# Patient Record
Sex: Male | Born: 1950
Health system: Southern US, Community
[De-identification: ages and names within clinical notes are randomized; demographics above are authoritative.]

## PROBLEM LIST (undated history)

## (undated) DIAGNOSIS — N400 Enlarged prostate without lower urinary tract symptoms: Secondary | ICD-10-CM

## (undated) DIAGNOSIS — N529 Male erectile dysfunction, unspecified: Secondary | ICD-10-CM

## (undated) DIAGNOSIS — G43909 Migraine, unspecified, not intractable, without status migrainosus: Secondary | ICD-10-CM

## (undated) DIAGNOSIS — N281 Cyst of kidney, acquired: Secondary | ICD-10-CM

## (undated) HISTORY — DX: Male erectile dysfunction, unspecified: N52.9

## (undated) HISTORY — DX: Cyst of kidney, acquired: N28.1

## (undated) HISTORY — DX: Benign prostatic hyperplasia without lower urinary tract symptoms: N40.0

## (undated) HISTORY — DX: Migraine, unspecified, not intractable, without status migrainosus: G43.909

---

## 2004-07-26 ENCOUNTER — Ambulatory Visit: Payer: Self-pay | Admitting: Urology

## 2007-07-15 ENCOUNTER — Ambulatory Visit: Payer: Self-pay | Admitting: Urology

## 2009-07-10 ENCOUNTER — Ambulatory Visit: Payer: Self-pay | Admitting: Urology

## 2011-03-30 ENCOUNTER — Ambulatory Visit: Payer: Self-pay | Admitting: Family Medicine

## 2011-07-08 ENCOUNTER — Ambulatory Visit: Payer: Self-pay | Admitting: Urology

## 2012-07-06 DIAGNOSIS — N411 Chronic prostatitis: Secondary | ICD-10-CM | POA: Insufficient documentation

## 2012-07-06 DIAGNOSIS — R972 Elevated prostate specific antigen [PSA]: Secondary | ICD-10-CM | POA: Insufficient documentation

## 2012-07-06 DIAGNOSIS — N529 Male erectile dysfunction, unspecified: Secondary | ICD-10-CM | POA: Insufficient documentation

## 2012-07-08 DIAGNOSIS — R339 Retention of urine, unspecified: Secondary | ICD-10-CM | POA: Insufficient documentation

## 2014-08-01 DIAGNOSIS — Z8719 Personal history of other diseases of the digestive system: Secondary | ICD-10-CM | POA: Insufficient documentation

## 2016-07-01 DIAGNOSIS — E785 Hyperlipidemia, unspecified: Secondary | ICD-10-CM | POA: Insufficient documentation

## 2018-05-15 ENCOUNTER — Ambulatory Visit: Payer: Self-pay | Admitting: Urology

## 2018-05-28 ENCOUNTER — Encounter: Payer: Self-pay | Admitting: Urology

## 2018-05-28 ENCOUNTER — Ambulatory Visit (INDEPENDENT_AMBULATORY_CARE_PROVIDER_SITE_OTHER): Payer: Medicare Other | Admitting: Urology

## 2018-05-28 VITALS — BP 138/71 | HR 69 | Ht 70.0 in | Wt 180.0 lb

## 2018-05-28 DIAGNOSIS — N281 Cyst of kidney, acquired: Secondary | ICD-10-CM | POA: Insufficient documentation

## 2018-05-28 DIAGNOSIS — R972 Elevated prostate specific antigen [PSA]: Secondary | ICD-10-CM

## 2018-05-28 DIAGNOSIS — N138 Other obstructive and reflux uropathy: Secondary | ICD-10-CM | POA: Insufficient documentation

## 2018-05-28 DIAGNOSIS — N5201 Erectile dysfunction due to arterial insufficiency: Secondary | ICD-10-CM | POA: Diagnosis not present

## 2018-05-28 DIAGNOSIS — N400 Enlarged prostate without lower urinary tract symptoms: Secondary | ICD-10-CM | POA: Diagnosis not present

## 2018-05-28 DIAGNOSIS — N401 Enlarged prostate with lower urinary tract symptoms: Secondary | ICD-10-CM | POA: Insufficient documentation

## 2018-05-28 LAB — URINALYSIS, COMPLETE
Bilirubin, UA: NEGATIVE
Glucose, UA: NEGATIVE
Leukocytes, UA: NEGATIVE
Nitrite, UA: NEGATIVE
Specific Gravity, UA: >1.03 — ABNORMAL HIGH (ref 1.005–1.030)
Urobilinogen, Ur: 1 mg/dL (ref 0.2–1.0)
pH, UA: 5 (ref 5.0–7.5)

## 2018-05-28 LAB — MICROSCOPIC EXAMINATION
Epithelial Cells (non renal): NONE SEEN /hpf (ref 0–10)
WBC, UA: NONE SEEN /hpf (ref 0–5)

## 2018-05-28 LAB — BLADDER SCAN AMB NON-IMAGING

## 2018-05-28 NOTE — Progress Notes (Signed)
   05/28/2018 2:09 PM   Derrick Carroll 07-16-1951 [id number]  Referring provider: No referring provider defined for this encounter.  Chief Complaint  Patient presents with  . Establish Care    HPI: 67 year old male presents to establish local urologic care.  He has previously been followed by Dr. Achilles Carroll for BPH and renal cysts.  He has a history of an elevated PSA.  He last saw Dr. Achilles Carroll at [facility] in March 2019.  He had a TURP at [facility] in 2017.  He has no bothersome lower urinary tract symptoms.  He is taking finasteride 5 mg daily and sildenafil as needed for ED.  He has been followed for a left parapelvic cyst with mild pyelocaliectasis.  Renal ultrasound March 2019 was stable.  Last PSA was March 2018 and was 0.12 (uncorrected).   PMH: Hypercholesterolemia  Surgical History: . INGUINAL HERNIA REPAIR Left  . INGUINAL HERNIA REPAIR Right  . PR TRANSURETHRAL ELEC-SURG PROSTATECTOM N/A 07/01/2016   Home Medications:  Simvastatin Finasteride Sildenafil  Allergies: No Known Allergies  Family History: No family history on file.  Social History:  reports that he quit smoking about 44 years ago. His smoking use included cigarettes. He does not have any smokeless tobacco history on file. He reports that he drank alcohol. He reports that he does not use drugs.  ROS: UROLOGY Frequent Urination?: No Hard to postpone urination?: No Burning/pain with urination?: No Get up at night to urinate?: Yes Leakage of urine?: No Urine stream starts and stops?: No Trouble starting stream?: No Do you have to strain to urinate?: No Blood in urine?: No Urinary tract infection?: No Sexually transmitted disease?: No Injury to kidneys or bladder?: No Painful intercourse?: No Weak stream?: No Erection problems?: No Penile pain?: No  Gastrointestinal Nausea?: No Vomiting?: No Indigestion/heartburn?: No Diarrhea?: No Constipation?: No  Constitutional Fever: No Night sweats?: No Weight  loss?: No Fatigue?: No  Skin Skin rash/lesions?: No Itching?: No  Eyes Blurred vision?: No Double vision?: No  Ears/Nose/Throat Sore throat?: No Sinus problems?: No  Hematologic/Lymphatic Swollen glands?: No Easy bruising?: No  Cardiovascular Leg swelling?: No Chest pain?: No  Respiratory Cough?: No Shortness of breath?: No  Endocrine Excessive thirst?: No  Musculoskeletal Back pain?: No Joint pain?: No  Neurological Headaches?: No Dizziness?: No  Psychologic Depression?: No Anxiety?: No  Physical Exam: BP 138/71 (BP Location: Left Arm, Patient Position: Sitting, Cuff Size: Normal)   Pulse 69   Ht 5\' 10"  (1.778 m)   Wt 180 lb (81.6 kg)   BMI 25.83 kg/m   Constitutional:  Alert and oriented, No acute distress. HEENT: [place] AT, moist mucus membranes.  Trachea midline, no masses. Cardiovascular: No clubbing, cyanosis, or edema. Respiratory: Normal respiratory effort, no increased work of breathing. GU: No CVA tenderness, DRE done by Dr. Achilles Carroll March 2019 Lymph: No cervical or inguinal lymphadenopathy. Skin: No rashes, bruises or suspicious lesions. Neurologic: Grossly intact, no focal deficits, moving all 4 extremities. Psychiatric: Normal mood and affect.  Laboratory Data:  Urinalysis Dipstick/microscopy negative   Assessment & Plan:   Currently doing well.  His last PSA was March 2018 and was drawn today.  Will schedule a follow-up renal ultrasound March 2020 and see him back in 1 year.  Return in about 1 year (around 05/29/2019) for Recheck.   Riki Altes, MD  [facility] [address], [address] [place], [place] [address] [phone]

## 2018-05-29 ENCOUNTER — Telehealth: Payer: Self-pay | Admitting: Family Medicine

## 2018-05-29 LAB — PSA: Prostate Specific Ag, Serum: 0.1 ng/mL (ref 0.0–4.0)

## 2018-05-29 NOTE — Telephone Encounter (Signed)
LMOM and notified patient

## 2018-05-29 NOTE — Telephone Encounter (Signed)
-----   Message from Riki Altes, MD sent at 05/29/2018  7:02 AM EDT ----- PSA stable at 0.1

## 2018-06-30 ENCOUNTER — Ambulatory Visit
Admission: RE | Admit: 2018-06-30 | Discharge: 2018-06-30 | Disposition: A | Discharge status: Home / Self Care | Payer: Medicare Other | Source: Ambulatory Visit | Attending: Urology | Admitting: Urology

## 2018-06-30 DIAGNOSIS — N281 Cyst of kidney, acquired: Secondary | ICD-10-CM | POA: Diagnosis not present

## 2018-07-02 ENCOUNTER — Telehealth: Payer: Self-pay | Admitting: Family Medicine

## 2018-07-02 NOTE — Telephone Encounter (Signed)
LMOM notifying patient RUS has no Significant abnormalities.

## 2018-07-02 NOTE — Telephone Encounter (Signed)
-----   Message from Riki AltesScott C Stoioff, MD sent at 07/02/2018  7:32 AM EST ----- Renal ultrasound showed stable renal cysts and no significant abnormalities.

## 2018-10-19 ENCOUNTER — Other Ambulatory Visit: Payer: Self-pay | Admitting: Urology

## 2018-10-19 NOTE — Telephone Encounter (Signed)
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

## 2018-10-20 MED ORDER — FINASTERIDE 5 MG PO TABS: 5.0000 mg | ORAL_TABLET | Freq: Every day | ORAL | 11 refills | Status: DC

## 2018-10-20 NOTE — Telephone Encounter (Signed)
RX sent to pharmacy  

## 2019-01-30 ENCOUNTER — Emergency Department: Payer: Medicare Other

## 2019-01-30 ENCOUNTER — Emergency Department
Admission: EM | Admit: 2019-01-30 | Discharge: 2019-01-30 | Disposition: A | Discharge status: Home / Self Care | Payer: Medicare Other | Attending: Emergency Medicine | Admitting: Emergency Medicine

## 2019-01-30 ENCOUNTER — Other Ambulatory Visit: Payer: Self-pay

## 2019-01-30 DIAGNOSIS — Z87891 Personal history of nicotine dependence: Secondary | ICD-10-CM | POA: Insufficient documentation

## 2019-01-30 DIAGNOSIS — R51 Headache: Secondary | ICD-10-CM | POA: Diagnosis not present

## 2019-01-30 DIAGNOSIS — Z79899 Other long term (current) drug therapy: Secondary | ICD-10-CM | POA: Diagnosis not present

## 2019-01-30 DIAGNOSIS — R42 Dizziness and giddiness: Secondary | ICD-10-CM | POA: Diagnosis not present

## 2019-01-30 DIAGNOSIS — R519 Headache, unspecified: Secondary | ICD-10-CM

## 2019-01-30 LAB — URINALYSIS, COMPLETE (UACMP) WITH MICROSCOPIC
Bacteria, UA: NONE SEEN
Bilirubin Urine: NEGATIVE
Glucose, UA: NEGATIVE mg/dL
Hgb urine dipstick: NEGATIVE
Ketones, ur: NEGATIVE mg/dL
Leukocytes,Ua: NEGATIVE
Nitrite: NEGATIVE
Protein, ur: NEGATIVE mg/dL
Specific Gravity, Urine: 1.01 (ref 1.005–1.030)
Squamous Epithelial / LPF: NONE SEEN (ref 0–5)
WBC, UA: NONE SEEN WBC/hpf (ref 0–5)
pH: 6 (ref 5.0–8.0)

## 2019-01-30 LAB — TROPONIN I (HIGH SENSITIVITY)
Troponin I (High Sensitivity): 5 ng/L (ref <18)
Troponin I (High Sensitivity): 5 ng/L (ref <18)

## 2019-01-30 LAB — COMPREHENSIVE METABOLIC PANEL
ALT: 26 U/L (ref 0–44)
AST: 25 U/L (ref 15–41)
Albumin: 4 g/dL (ref 3.5–5.0)
Alkaline Phosphatase: 46 U/L (ref 38–126)
Anion gap: 9 (ref 5–15)
BUN: 15 mg/dL (ref 8–23)
CO2: 25 mmol/L (ref 22–32)
Calcium: 8.7 mg/dL — ABNORMAL LOW (ref 8.9–10.3)
Chloride: 106 mmol/L (ref 98–111)
Creatinine, Ser: 0.87 mg/dL (ref 0.61–1.24)
GFR calc Af Amer: >60 mL/min (ref >60)
GFR calc non Af Amer: >60 mL/min (ref >60)
Glucose, Bld: 108 mg/dL — ABNORMAL HIGH (ref 70–99)
Potassium: 3.6 mmol/L (ref 3.5–5.1)
Sodium: 140 mmol/L (ref 135–145)
Total Bilirubin: 0.8 mg/dL (ref 0.3–1.2)
Total Protein: 7 g/dL (ref 6.5–8.1)

## 2019-01-30 LAB — CBC
HCT: 48.1 % (ref 39.0–52.0)
Hemoglobin: 16 g/dL (ref 13.0–17.0)
MCH: 32.2 pg (ref 26.0–34.0)
MCHC: 33.3 g/dL (ref 30.0–36.0)
MCV: 96.8 fL (ref 80.0–100.0)
Platelets: 221 10*3/uL (ref 150–400)
RBC: 4.97 MIL/uL (ref 4.22–5.81)
RDW: 12.6 % (ref 11.5–15.5)
WBC: 5.8 10*3/uL (ref 4.0–10.5)
nRBC: 0 % (ref 0.0–0.2)

## 2019-01-30 MED ORDER — SODIUM CHLORIDE 0.9 % IV BOLUS
1000.0000 mL | Freq: Once | INTRAVENOUS | Status: AC
  Administered 2019-01-30: 1000 mL via INTRAVENOUS

## 2019-01-30 MED ORDER — MECLIZINE HCL 25 MG PO TABS
25.0000 mg | ORAL_TABLET | Freq: Once | ORAL | Status: AC
  Administered 2019-01-30: 25 mg via ORAL
  Filled 2019-01-30: qty 1

## 2019-01-30 NOTE — ED Notes (Signed)
Pt able to ambulate without difficulty.

## 2019-01-30 NOTE — Discharge Instructions (Signed)
Your MRI of the brain and labs were all okay today. Take meclizine as needed to control vertigo symptoms.

## 2019-01-30 NOTE — ED Notes (Signed)
Patient transported to CT 

## 2019-01-30 NOTE — ED Triage Notes (Addendum)
Patient reports felt "lightheaded" when he woke up during the night and rolled over in the bed.  Feeling of lightheadedness continued when he got up to let dog outside, also reports having nausea.  Also reports left arm felt tingly.  Patient reports woke up around 2:30 am (to bed b/w 11 and 11:30 pm after taking Goody powder for headache).

## 2019-01-30 NOTE — ED Notes (Signed)
Pt states last night he had a headache and took a "goody powder". Pt states while lying in bed this morning before getting up 'I felt funny, I moved my head and I thought, that doesn't feel right". Pt states he then heard his dog barking, got up to let his dog out , felt lightheaded, dizzy and "felt like I was going to pass out". Pt denies room spinning. Pt states he had felt intermittently nauseated and had some left arm tingling that resolved pta.

## 2019-01-30 NOTE — ED Notes (Signed)
Pt on phone with MRI, report to angela, rn.

## 2019-01-30 NOTE — ED Notes (Signed)
Pt currently off the unit, in MRI

## 2019-01-30 NOTE — ED Provider Notes (Signed)
[facility] Emergency Department Provider Note  ____________________________________________  Time seen: Approximately 6:42 AM  I have reviewed the triage vital signs and the nursing notes.   HISTORY  Chief Complaint Dizziness   HPI Derrick Carroll is a 68 y.o. male with a history of BPH who presents for evaluation of dizziness.  Patient reports that he had a moderate headache yesterday.  Before going to bed he took a Goody powder.  He reports having history of headaches like the one from last night very frequently.  This morning he woke up with his dog barking the garage.  Before he got up from bed he felt lightheaded.  After getting up patient reports that he felt dizzy.  He describes dizziness as feeling like he was going to pass out but also feeling off balance.  The headache has resolved.  Patient denies history of vertigo.  He denies any head trauma.  He denies changes in vision, slurred speech, facial droop, unilateral weakness or numbness, personal family history of stroke.  Patient is a former smoker.  He denies chest pain, fever, shortness of breath.  Does endorse mild nausea but no vomiting.   Patient Active Problem List   Diagnosis Date Noted  . Renal cyst 05/28/2018  . BPH without obstruction/lower urinary tract symptoms 05/28/2018  . Erectile dysfunction due to arterial insufficiency 05/28/2018    No past surgical history on file.  Prior to Admission medications   Medication Sig Start Date End Date Taking? Authorizing Provider  finasteride (PROSCAR) 5 MG tablet Take 1 tablet (5 mg total) by mouth daily. 10/20/18   Stoioff, Verna CzechScott C, MD  Multiple Vitamin (MULTI-VITAMINS) TABS Take by mouth.    [provider]  sildenafil (REVATIO) 20 MG tablet 3-5 tablets po daily as needed 10/23/17   [provider]  simvastatin (ZOCOR) 20 MG tablet TAKE 1 TABLET BY MOUTH EVERY DAY AT NIGHT 07/05/18   [provider]    Allergies  Patient has no known allergies.  No family history on file.  Social History Social History   Tobacco Use  . Smoking status: Former Smoker    Types: Cigarettes    Quit date: 07/29/1973    Years since quitting: 45.5  Substance Use Topics  . Alcohol use: Not Currently  . Drug use: Never    Review of Systems  Constitutional: Negative for fever. + dizziness Eyes: Negative for visual changes. ENT: Negative for sore throat. Neck: No neck pain  Cardiovascular: Negative for chest pain. Respiratory: Negative for shortness of breath. Gastrointestinal: Negative for abdominal pain, vomiting or diarrhea. + nausea Genitourinary: Negative for dysuria. Musculoskeletal: Negative for back pain. Skin: Negative for rash. Neurological: Negative for weakness or numbness. + HA Psych: No SI or HI  ____________________________________________   PHYSICAL EXAM:  VITAL SIGNS: ED Triage Vitals  Enc Vitals Group     BP 01/30/19 0538 (!) 158/95     Pulse Rate 01/30/19 0538 (!) 59     Resp 01/30/19 0538 18     Temp 01/30/19 0538 98.1 F (36.7 C)     Temp Source 01/30/19 0538 Oral     SpO2 01/30/19 0538 97 %     Weight 01/30/19 0539 180 lb (81.6 kg)     Height 01/30/19 0539 5\' 10"  (1.778 m)     Head Circumference --      Peak Flow --      Pain Score 01/30/19 0539 0     Pain Loc --  Pain Edu? --      Excl. in [place]? --     Constitutional: Alert and oriented. Well appearing and in no apparent distress. HEENT:      Head: Normocephalic and atraumatic.         Eyes: Conjunctivae are normal. Sclera is non-icteric.       Mouth/Throat: Mucous membranes are moist.       Neck: Supple with no signs of meningismus. Cardiovascular: Regular rate and rhythm. No murmurs, gallops, or rubs. 2+ symmetrical distal pulses are present in all extremities. No JVD. Respiratory: Normal respiratory effort. Lungs are clear to auscultation bilaterally. No wheezes, crackles, or rhonchi.  Gastrointestinal: Soft, non  tender, and non distended with positive bowel sounds. No rebound or guarding. Musculoskeletal: Nontender with normal range of motion in all extremities. No edema, cyanosis, or erythema of extremities. Neurologic: Normal speech and language. Face is symmetric.  Intact strength and sensation x4, no dysmetria or pronator drift Skin: Skin is warm, dry and intact. No rash noted. Psychiatric: Mood and affect are normal. Speech and behavior are normal.  ____________________________________________   LABS (all labs ordered are listed, but only abnormal results are displayed)  Labs Reviewed  COMPREHENSIVE METABOLIC PANEL - Abnormal; Notable for the following components:      Result Value   Glucose, Bld 108 (*)    Calcium 8.7 (*)    All other components within normal limits  CBC  TROPONIN I (HIGH SENSITIVITY)  TROPONIN I (HIGH SENSITIVITY)  URINALYSIS, COMPLETE (UACMP) WITH MICROSCOPIC   ____________________________________________  EKG  ED ECG REPORT I, Rudene Re, the attending physician, personally viewed and interpreted this ECG.  Normal sinus rhythm, rate of 61, normal intervals, normal axis, no ST elevations or depressions. ____________________________________________  RADIOLOGY  I have personally reviewed the images performed during this visit and I agree with the Radiologist's read.   Interpretation by Radiologist:  Ct Head Wo Contrast  Result Date: 01/30/2019 CLINICAL DATA:  68 y/o M; lightheadedness, nausea, left upper extremity tingling. EXAM: CT HEAD WITHOUT CONTRAST TECHNIQUE: Contiguous axial images were obtained from the base of the skull through the vertex without intravenous contrast. COMPARISON:  03/30/2011 MRI head. FINDINGS: Brain: No evidence of acute infarction, hemorrhage, hydrocephalus, extra-axial collection or mass lesion/mass effect. Vascular: No hyperdense vessel or unexpected calcification. Skull: Normal. Negative for fracture or focal lesion.  Sinuses/Orbits: No acute finding. Other: None. IMPRESSION: No acute intracranial abnormality. Stable unremarkable CT of the head given differences in technique. Electronically Signed   By: Kristine Garbe M.D.   On: 01/30/2019 06:48      ____________________________________________   PROCEDURES  Procedure(s) performed: None Procedures Critical Care performed:  None ____________________________________________   INITIAL IMPRESSION / ASSESSMENT AND PLAN / ED COURSE  68 y.o. male with a history of BPH who presents for evaluation of dizziness since this am. Ddx dehydration, orthostasis, anemia, AKI, peripheral vertigo, stroke, head bleed, dysrhythmia.  Patient is completely neurologically intact.  EKG with no evidence of dysrhythmias or ischemia. Patient be monitored on telemetry.  Labs showing normal glucose, normal electrolytes, normal kidney function, no anemia, negative troponin.  Head CT showed no acute abnormality. Will give IVF and meclizine.   _________________________ 6:51 AM on 01/30/2019 -----------------------------------------  Patient report that the dizzy spells are now only present when he moves his head making it more concerning for vertigo.  We will get an MRI to rule out posterior fossa stroke. Care transferred to Dr. Joni Fears      As part  of my medical decision making, I reviewed the following data within the electronic MEDICAL RECORD NUMBER Nursing notes reviewed and incorporated, Labs reviewed , EKG interpreted , Old chart reviewed, Radiograph reviewed , Notes from prior ED visits and [place] Controlled Substance Database    Pertinent labs & imaging results that were available during my care of the patient were reviewed by me and considered in my medical decision making (see chart for details).    ____________________________________________   FINAL CLINICAL IMPRESSION(S) / ED DIAGNOSES  Final diagnoses:  Vertigo  Acute nonintractable headache, unspecified  headache type      NEW MEDICATIONS STARTED DURING THIS VISIT:  ED Discharge Orders    None       Note:  This document was prepared using Dragon voice recognition software and may include unintentional dictation errors.    Don PerkingVeronese, [place], MD 01/30/19 [phone]

## 2019-01-30 NOTE — ED Provider Notes (Signed)
-----------------------------------------   1:22 PM on 01/30/2019 -----------------------------------------   Vitals remain normal.  Patient symptoms have dramatically improved and he is ambulatory without difficulty.  MRI is normal.  This appears to be peripheral vertigo which is now resolving.  He can continue to take meclizine as needed, follow-up with primary care.  No evidence of intracranial hemorrhage, stroke, tumor, intracranial hypertension or other acute pathology.   Carrie Mew, MD 01/30/19 1322

## 2019-04-27 DIAGNOSIS — G43019 Migraine without aura, intractable, without status migrainosus: Secondary | ICD-10-CM | POA: Insufficient documentation

## 2019-05-28 ENCOUNTER — Other Ambulatory Visit: Payer: Self-pay

## 2019-05-28 ENCOUNTER — Encounter: Payer: Self-pay | Admitting: Urology

## 2019-05-28 ENCOUNTER — Ambulatory Visit: Payer: Self-pay | Admitting: Urology

## 2019-05-28 ENCOUNTER — Ambulatory Visit (INDEPENDENT_AMBULATORY_CARE_PROVIDER_SITE_OTHER): Payer: Medicare Other | Admitting: Urology

## 2019-05-28 VITALS — BP 141/92 | HR 68 | Ht 70.0 in | Wt 184.0 lb

## 2019-05-28 DIAGNOSIS — N5201 Erectile dysfunction due to arterial insufficiency: Secondary | ICD-10-CM | POA: Diagnosis not present

## 2019-05-28 DIAGNOSIS — N401 Enlarged prostate with lower urinary tract symptoms: Secondary | ICD-10-CM | POA: Diagnosis not present

## 2019-05-28 LAB — BLADDER SCAN AMB NON-IMAGING

## 2019-05-28 MED ORDER — SILDENAFIL CITRATE 20 MG PO TABS: ORAL_TABLET | ORAL | 0 refills | Status: DC

## 2019-05-28 MED ORDER — FINASTERIDE 5 MG PO TABS: 5.0000 mg | ORAL_TABLET | Freq: Every day | ORAL | 6 refills | Status: DC

## 2019-05-28 NOTE — Progress Notes (Signed)
05/28/2019 9:44 AM   Derrick Carroll [birth year] [id number]  Referring provider: Jerl Mina, MD [address] [place],  [place] [address]  Chief Complaint  Patient presents with  . Follow-up    Urologic history: 1.  BPH  -TURP Dr. [name] 2017  -Remains on finasteride 5 mg daily  2.  Erectile dysfunction  -Generic sildenafil  3.  Renal cysts  -Bilateral renal cysts  -Simple   HPI: 68 y.o. male previously followed by Dr. [name].  He presents for annual follow-up.  He has mild lower urinary tract symptoms with occasional frequency, decreased stream and sensation of incomplete emptying.  He gets up once per night to void.  IPSS completed today was 4/35 with a QoL rated 2/6.  He has continued on finasteride since his TURP.  PSA last year was 0.1.  Renal ultrasound performed December 2019 showed a 2.6 cm simple cyst in the right kidney and multiple left renal cysts largest measuring 4 cm which had mildly increased in size since a prior comparison study in 2012  Denies dysuria or gross hematuria.  Denies flank, abdominal or pelvic pain.   PMH: Past Medical History:  Diagnosis Date  . BPH (benign prostatic hyperplasia)   . ED (erectile dysfunction)   . Migraine headache   . Renal cyst     Surgical History: No past surgical history on file.  Home Medications:  Allergies as of 05/28/2019      Reactions   Chlorpheniramine    Other reaction(s): Unknown      Medication List       Accurate as of May 28, 2019  9:44 AM. If you have any questions, ask your nurse or doctor.        Acetaminophen 500 MG coapsule Take by mouth.   finasteride 5 MG tablet Commonly known as: PROSCAR Take 1 tablet (5 mg total) by mouth daily.   MAGNESIUM OXIDE PO Take 1,400 mg by mouth daily.   Multi-Vitamins Tabs Take by mouth.   sildenafil 20 MG tablet Commonly known as: REVATIO 3-5 tablets po daily as needed   simvastatin 20 MG tablet Commonly known as:  ZOCOR TAKE 1 TABLET BY MOUTH EVERY DAY AT NIGHT   SUMAtriptan 100 MG tablet Commonly known as: IMITREX Take 100 mg by mouth 2 (two) times daily as needed.       Allergies:  Allergies  Allergen Reactions  . Chlorpheniramine     Other reaction(s): Unknown    Family History: No family history on file.  Social History:  reports that he quit smoking about 45 years ago. His smoking use included cigarettes. He has never used smokeless tobacco. He reports previous alcohol use. He reports that he does not use drugs.  ROS: UROLOGY Frequent Urination?: No Hard to postpone urination?: No Burning/pain with urination?: No Get up at night to urinate?: No Leakage of urine?: No Urine stream starts and stops?: No Trouble starting stream?: No Do you have to strain to urinate?: No Blood in urine?: No Urinary tract infection?: No Sexually transmitted disease?: No Injury to kidneys or bladder?: No Painful intercourse?: No Weak stream?: No Erection problems?: No Penile pain?: No  Gastrointestinal Nausea?: No Vomiting?: No Indigestion/heartburn?: No Diarrhea?: No Constipation?: No  Constitutional Fever: No Night sweats?: No Weight loss?: No Fatigue?: No  Skin Skin rash/lesions?: No Itching?: No  Eyes Blurred vision?: No Double vision?: No  Ears/Nose/Throat Sore throat?: No Sinus problems?: No  Hematologic/Lymphatic Swollen glands?: No Easy  bruising?: No  Cardiovascular Leg swelling?: No Chest pain?: No  Respiratory Cough?: No Shortness of breath?: No  Endocrine Excessive thirst?: No  Musculoskeletal Back pain?: No Joint pain?: No  Neurological Headaches?: No Dizziness?: No  Psychologic Depression?: No Anxiety?: No  Physical Exam: BP (!) 141/92 (BP Location: Left Arm, Patient Position: Sitting, Cuff Size: Normal)   Pulse 68   Ht 5\' 10"  (1.778 m)   Wt 184 lb (83.5 kg)   BMI 26.40 kg/m   Constitutional:  Alert and oriented, No acute distress.  HEENT: [place] AT, moist mucus membranes.  Trachea midline, no masses. Cardiovascular: No clubbing, cyanosis, or edema. Respiratory: Normal respiratory effort, no increased work of breathing. GI: Abdomen is soft, nontender, nondistended, no abdominal masses GU: No CVA tenderness Lymph: No cervical or inguinal lymphadenopathy. Skin: No rashes, bruises or suspicious lesions. Neurologic: Grossly intact, no focal deficits, moving all 4 extremities. Psychiatric: Normal mood and affect.   Assessment & Plan:    - BPH without lower urinary tract symptoms Stable.  He desires to continue finasteride.  - Erectile dysfunction Stable.  Continue sildenafil  Abbie Sons, MD  [facility] [address] [place], [place] [address] [phone]

## 2019-05-29 LAB — PSA: Prostate Specific Ag, Serum: 0.1 ng/mL (ref 0.0–4.0)

## 2019-05-30 ENCOUNTER — Encounter: Payer: Self-pay | Admitting: Urology

## 2019-05-31 ENCOUNTER — Telehealth: Payer: Self-pay

## 2019-05-31 NOTE — Telephone Encounter (Signed)
-----   Message from Abbie Sons, MD sent at 05/29/2019  9:57 AM EDT ----- PSA stable at 0.1

## 2019-05-31 NOTE — Telephone Encounter (Signed)
Called pt informed him of information below. Pt gave verbal understanding.  

## 2020-05-12 ENCOUNTER — Ambulatory Visit: Payer: Medicare PPO | Attending: Internal Medicine

## 2020-05-12 ENCOUNTER — Other Ambulatory Visit: Payer: Self-pay | Admitting: Internal Medicine

## 2020-05-12 DIAGNOSIS — Z23 Encounter for immunization: Secondary | ICD-10-CM

## 2020-05-12 NOTE — Progress Notes (Signed)
   Covid-19 Vaccination Clinic  Name:  [name]    MRN: [id number] DOB: 22-Mar-1951  05/12/2020  Mr. Mccutchan was observed post Covid-19 immunization for 15 minutes without incident. He was provided with Vaccine Information Sheet and instruction to access the V-Safe system.   Mr. Carriere was instructed to call 911 with any severe reactions post vaccine: Marland Kitchen Difficulty breathing  . Swelling of face and throat  . A fast heartbeat  . A bad rash all over body  . Dizziness and weakness

## 2020-05-14 ENCOUNTER — Other Ambulatory Visit: Payer: Self-pay | Admitting: Urology

## 2020-05-31 ENCOUNTER — Ambulatory Visit: Payer: Medicare PPO | Admitting: Urology

## 2020-05-31 ENCOUNTER — Other Ambulatory Visit: Payer: Self-pay

## 2020-05-31 ENCOUNTER — Encounter: Payer: Self-pay | Admitting: Urology

## 2020-05-31 VITALS — BP 152/91 | HR 61 | Ht 70.0 in | Wt 183.0 lb

## 2020-05-31 DIAGNOSIS — N401 Enlarged prostate with lower urinary tract symptoms: Secondary | ICD-10-CM | POA: Diagnosis not present

## 2020-05-31 LAB — BLADDER SCAN AMB NON-IMAGING: Scan Result: 7

## 2020-05-31 MED ORDER — FINASTERIDE 5 MG PO TABS: 5.0000 mg | ORAL_TABLET | Freq: Every day | ORAL | 0 refills | Status: DC

## 2020-05-31 MED ORDER — SILDENAFIL CITRATE 20 MG PO TABS: ORAL_TABLET | ORAL | 0 refills | Status: DC

## 2020-05-31 NOTE — Progress Notes (Signed)
05/31/2020 9:23 AM   Derrick Carroll [birth year] [id number]  Referring provider: Jerl Mina, MD [address] [place],  [place] [address]  Chief Complaint  Patient presents with  . Benign Prostatic Hypertrophy    Urologic history: 1.  BPH             -TURP Dr. [name] 2017             -Remains on finasteride 5 mg daily  2.  Erectile dysfunction             -Generic sildenafil  3.  Renal cysts             -Bilateral renal cysts             -Simple   HPI: 69 y.o. male presents for annual follow-up.   Since last years visit lower urinary tract symptoms are stable  Denies dysuria, gross hematuria  Denies flank, abdominal or pelvic pain  Remains on finasteride  Sildenafil effective for ED   PMH: Past Medical History:  Diagnosis Date  . BPH (benign prostatic hyperplasia)   . ED (erectile dysfunction)   . Migraine headache   . Renal cyst     Surgical History: No past surgical history on file.  Home Medications:  Allergies as of 05/31/2020      Reactions   Chlorpheniramine    Other reaction(s): Unknown      Medication List       Accurate as of May 31, 2020  9:23 AM. If you have any questions, ask your nurse or doctor.        Acetaminophen 500 MG capsule Take by mouth.   finasteride 5 MG tablet Commonly known as: PROSCAR TAKE 1 TABLET BY MOUTH EVERY DAY   MAGNESIUM OXIDE PO Take 1,400 mg by mouth daily.   Multi-Vitamins Tabs Take by mouth.   sildenafil 20 MG tablet Commonly known as: REVATIO 3-5 tablets po daily as needed   simvastatin 20 MG tablet Commonly known as: ZOCOR TAKE 1 TABLET BY MOUTH EVERY DAY AT NIGHT   SUMAtriptan 100 MG tablet Commonly known as: IMITREX Take 100 mg by mouth 2 (two) times daily as needed.       Allergies:  Allergies  Allergen Reactions  . Chlorpheniramine     Other reaction(s): Unknown    Family History: No family history on file.  Social History:  reports that he  quit smoking about 46 years ago. His smoking use included cigarettes. He has never used smokeless tobacco. He reports previous alcohol use. He reports that he does not use drugs.   Physical Exam: BP (!) 152/91   Pulse 61   Ht 5\' 10"  (1.778 m)   Wt 183 lb (83 kg)   BMI 26.26 kg/m   Constitutional:  Alert and oriented, No acute distress. HEENT: [place] AT, moist mucus membranes.  Trachea midline, no masses. Cardiovascular: No clubbing, cyanosis, or edema. Respiratory: Normal respiratory effort, no increased work of breathing. GI: Abdomen is soft, nontender, nondistended, no abdominal masses GU: Prostate 35 g, smooth without nodules Lymph: No cervical or inguinal lymphadenopathy. Skin: No rashes, bruises or suspicious lesions. Neurologic: Grossly intact, no focal deficits, moving all 4 extremities. Psychiatric: Normal mood and affect.   Assessment & Plan:    1. Benign prostatic hyperplasia with LUTS  Stable voiding symptoms on finasteride which was refilled  PVR bladder scan 7 mL  Desires to continue prostate cancer screening and PSA ordered  2.  Erectile dysfunction  Stable on sildenafil, refill sent  3.  Renal cysts  Asymptomatic  Continue annual follow-up   Riki Altes, MD  [facility] [address] [place], [place] [address] [phone]

## 2020-06-01 LAB — URINALYSIS, COMPLETE
Bilirubin, UA: NEGATIVE
Glucose, UA: NEGATIVE
Ketones, UA: NEGATIVE
Leukocytes,UA: NEGATIVE
Nitrite, UA: NEGATIVE
Protein,UA: NEGATIVE
Specific Gravity, UA: 1.025 (ref 1.005–1.030)
Urobilinogen, Ur: 0.2 mg/dL (ref 0.2–1.0)
pH, UA: 5 (ref 5.0–7.5)

## 2020-06-01 LAB — PSA: Prostate Specific Ag, Serum: 0.1 ng/mL (ref 0.0–4.0)

## 2020-06-01 LAB — MICROSCOPIC EXAMINATION: Bacteria, UA: NONE SEEN

## 2020-06-04 NOTE — Progress Notes (Signed)
PSA stable 0.1

## 2020-06-05 ENCOUNTER — Telehealth: Payer: Self-pay | Admitting: *Deleted

## 2020-06-05 NOTE — Telephone Encounter (Signed)
-----   Message from Riki Altes, MD sent at 06/04/2020 11:53 AM EST ----- PSA stable 0.1

## 2020-06-07 NOTE — Telephone Encounter (Signed)
Spoke with patient and advised results   

## 2020-06-15 ENCOUNTER — Other Ambulatory Visit: Payer: Self-pay | Admitting: General Surgery

## 2020-06-15 ENCOUNTER — Other Ambulatory Visit (HOSPITAL_COMMUNITY): Payer: Self-pay | Admitting: General Surgery

## 2020-06-15 DIAGNOSIS — R1031 Right lower quadrant pain: Secondary | ICD-10-CM

## 2020-06-19 ENCOUNTER — Ambulatory Visit
Admission: RE | Admit: 2020-06-19 | Discharge: 2020-06-19 | Disposition: A | Discharge status: Home / Self Care | Payer: Medicare PPO | Source: Ambulatory Visit | Attending: General Surgery | Admitting: General Surgery

## 2020-06-19 ENCOUNTER — Other Ambulatory Visit: Payer: Self-pay

## 2020-06-19 DIAGNOSIS — R1031 Right lower quadrant pain: Secondary | ICD-10-CM | POA: Diagnosis not present

## 2020-07-21 IMAGING — CR ORBITS FOR FOREIGN BODY - 2 VIEW
2 series · 2 of 2 positions shown · non-contrast
Comparison: None.

CLINICAL DATA: Metal working/exposure; clearance prior to MRI

EXAM:
ORBITS FOR FOREIGN BODY - 2 VIEW

[orbits waters (1 of 2)]
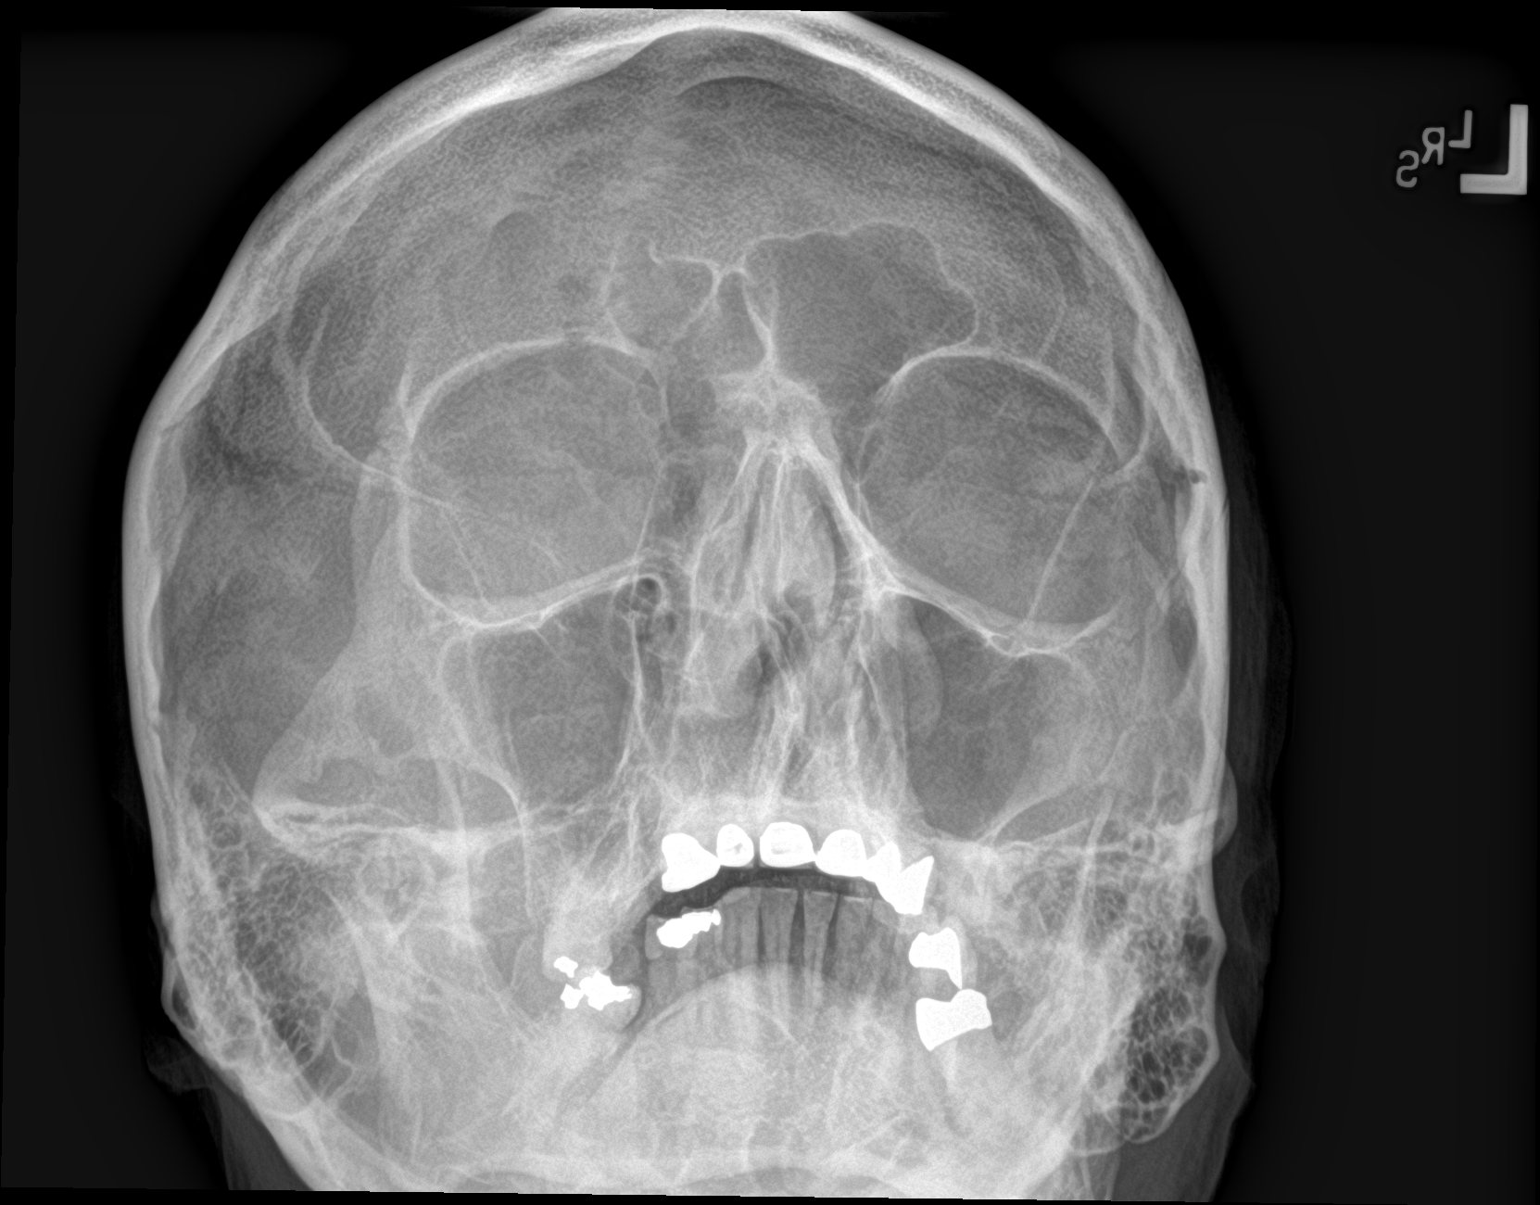

[orbits waters (2 of 2)]
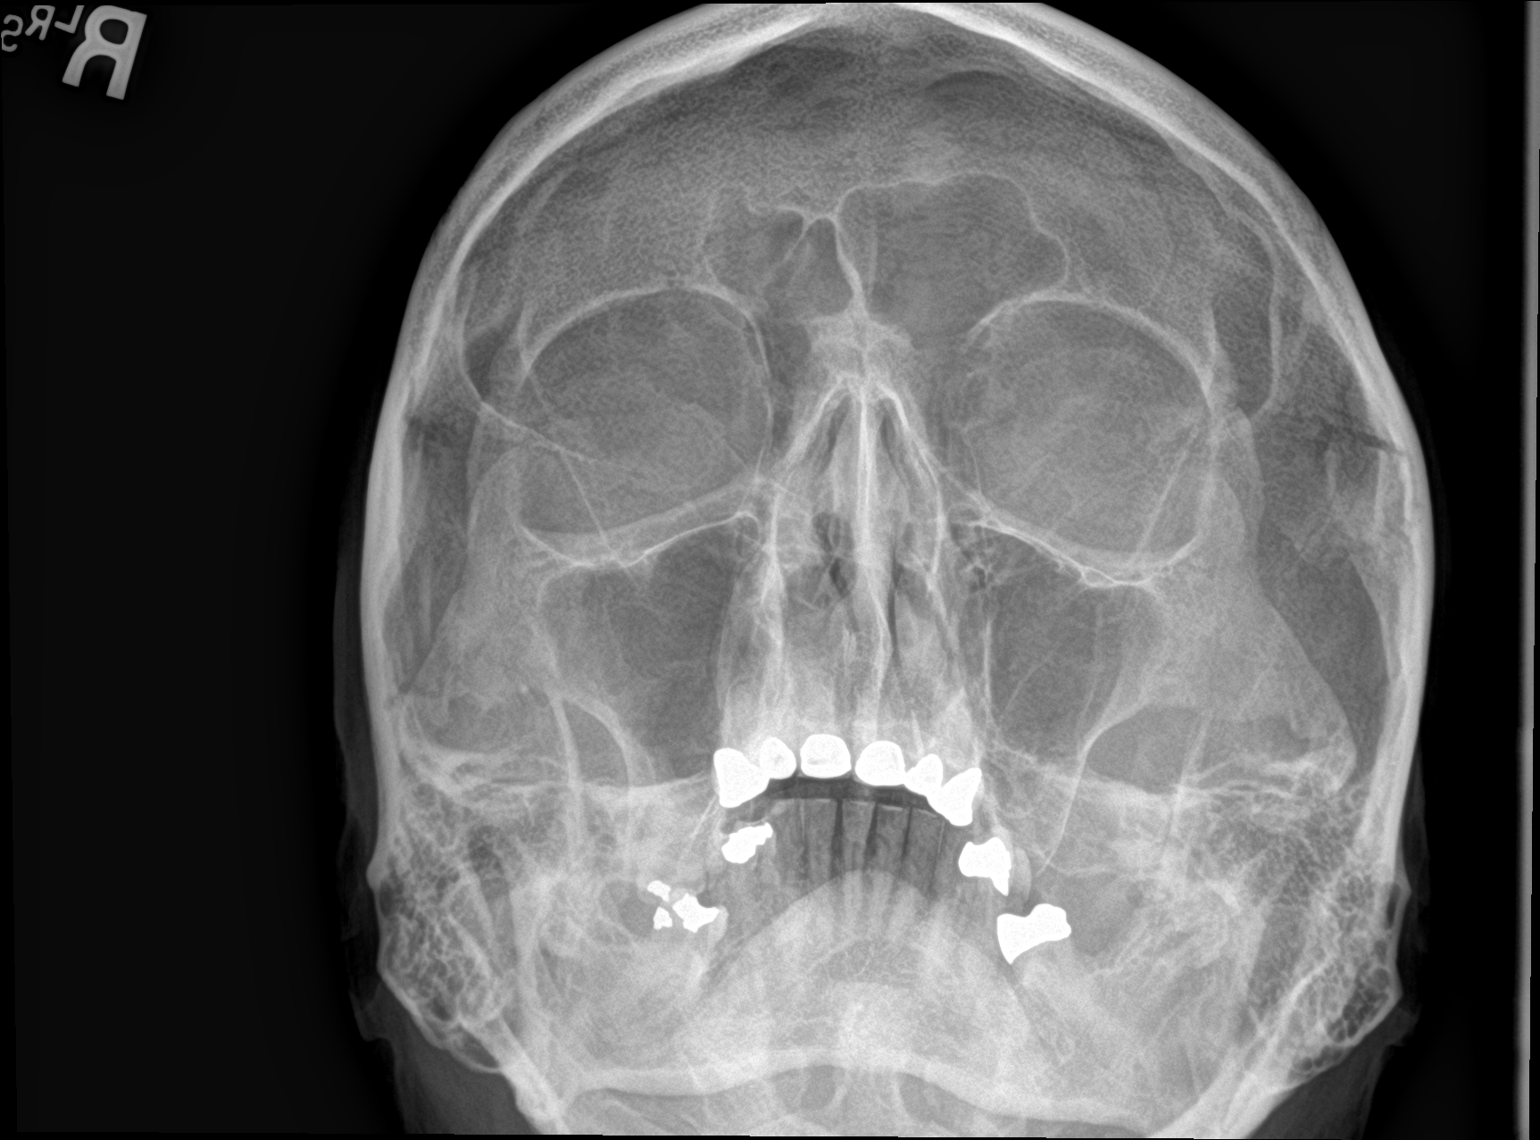

[2 of 2 positions shown; findings below may reference images not displayed]

FINDINGS: There is no evidence of metallic foreign body within the orbits. No
significant bone abnormality identified.
IMPRESSION: No evidence of metallic foreign body within the orbits.

## 2020-07-21 IMAGING — MR MRI HEAD WITHOUT CONTRAST
11 series · 45 of 48 positions shown · non-contrast
Comparison: Head CT 01/30/2019.  MRI 03/30/2011.

CLINICAL DATA: Acute presentation with dizziness and headache.

EXAM:
MRI HEAD WITHOUT CONTRAST
TECHNIQUE: Multiplanar, multiecho pulse sequences of the brain and surrounding
structures were obtained without intravenous contrast.

[Series 5: ax dwi_tracew · axial · 3.0mm · 0.60mm/px · z∈[-92,+70]mm · 4 of 55 slices shown]
[im 1/55]
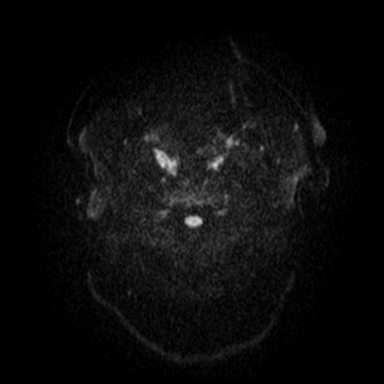
[im 19/55]
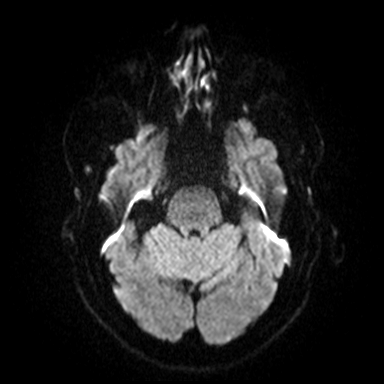
[im 37/55]
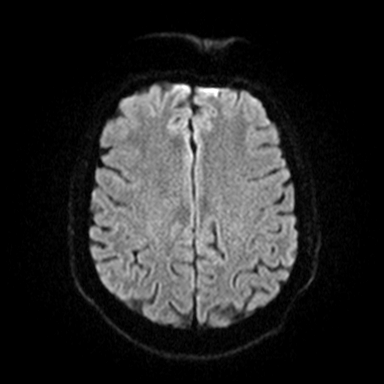
[im 55/55]
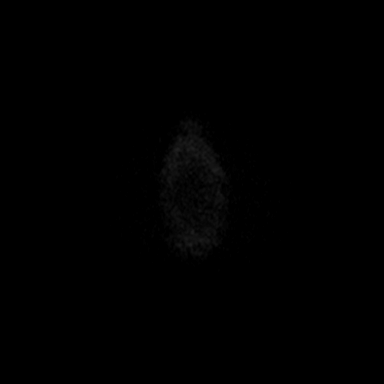

[Series 6: ax dwi_adc · axial · 3.0mm · 0.60mm/px · z∈[-92,+70]mm · 4 of 55 slices shown]
[im 1/55]
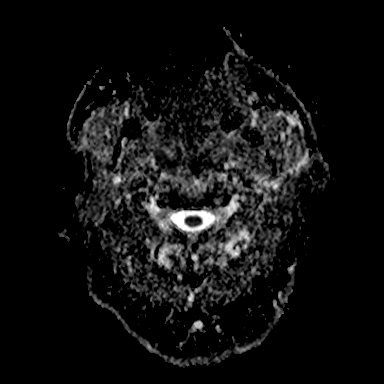
[im 19/55]
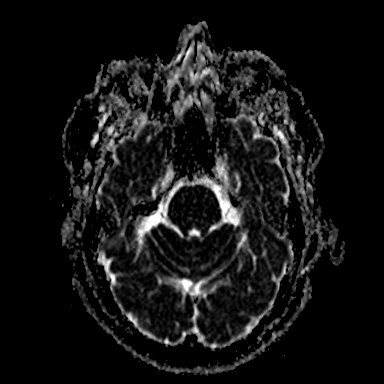
[im 37/55]
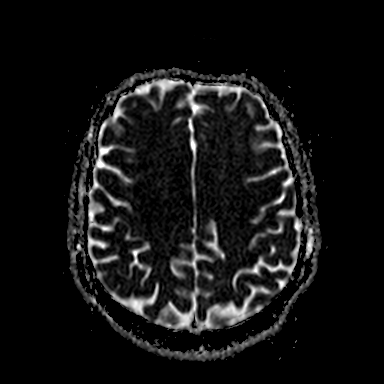
[im 55/55]
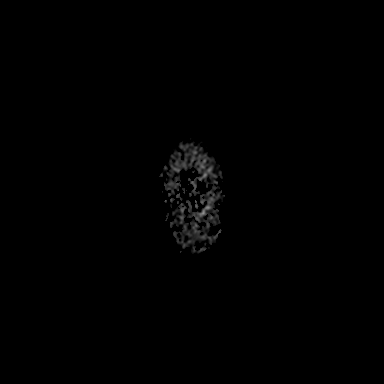

[Series 7: cor dwi_tracew · coronal · 5.0mm · 0.60mm/px · 3 of 39 slices shown]
[im 1/39]
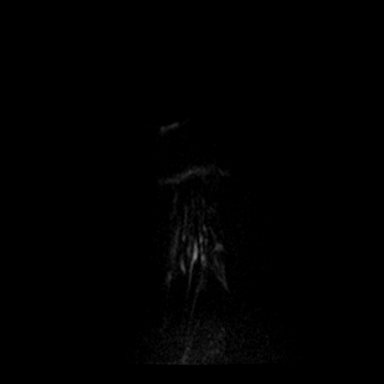
[im 20/39]
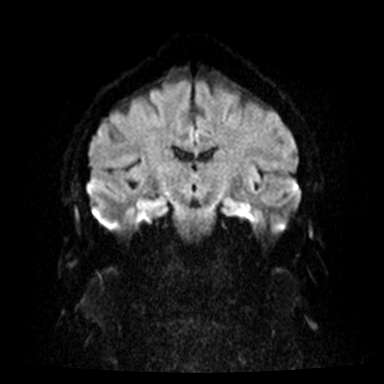
[im 39/39]
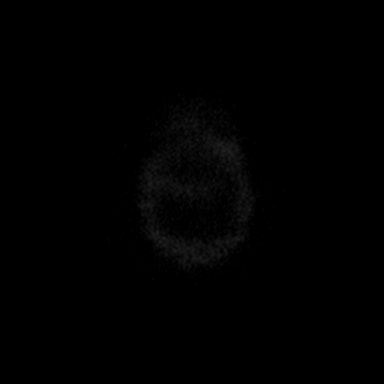

[Series 8: cor dwi_adc · coronal · 5.0mm · 0.60mm/px · 3 of 39 slices shown]
[im 1/39]
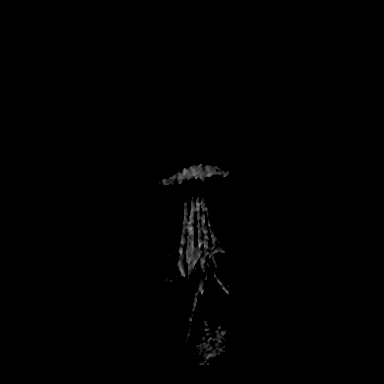
[im 20/39]
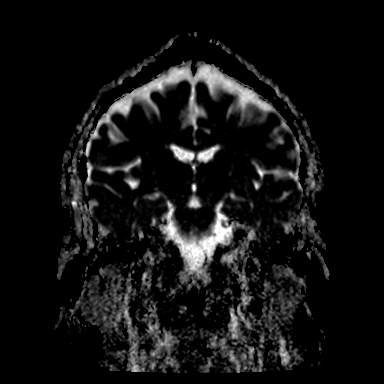
[im 39/39]
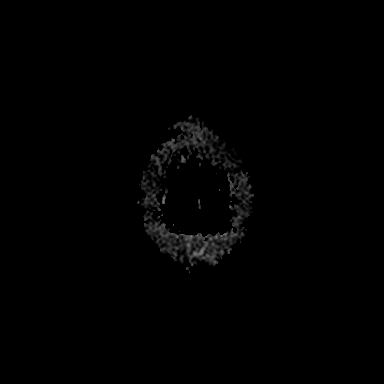

[Series 9: T1 · sagittal · 5.0mm · 0.62mm/px · 2 of 25 slices shown (1 of 2)]
[im 1/25]
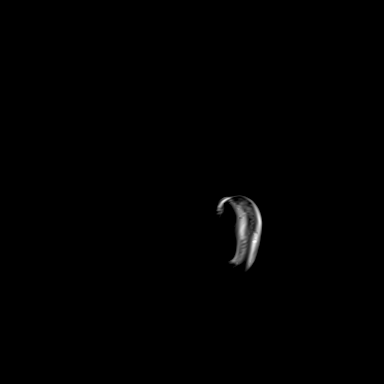
[im 25/25]
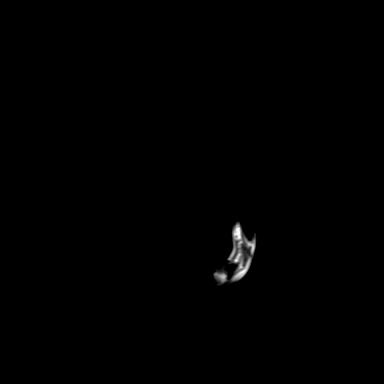

[Series 10: T2 · axial · 5.0mm · 0.53mm/px · z∈[-92,+69]mm · 2 of 28 slices shown (1 of 2)]
[im 1/28]
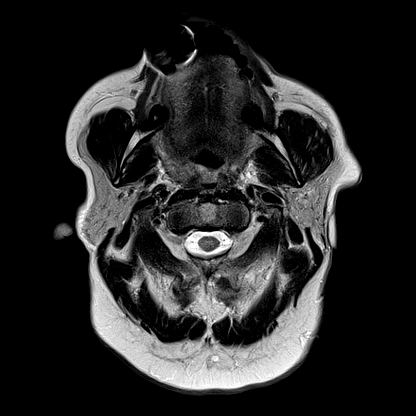
[im 28/28]
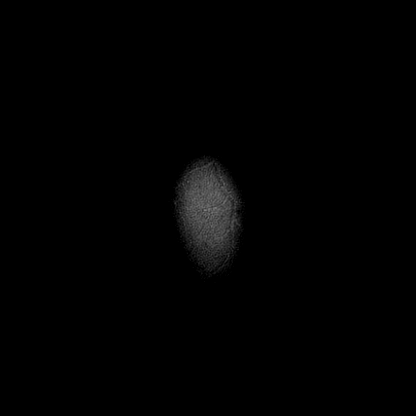

[Series 12: pha_images · axial · 3.0mm · 0.90mm/px · z∈[-100,+77]mm · 5 of 60 slices shown]
[im 1/60]
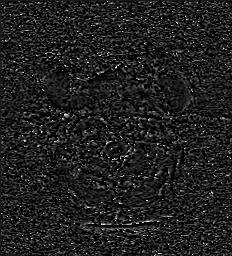
[im 15/60]
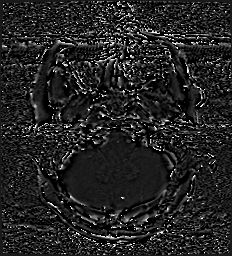
[im 30/60]
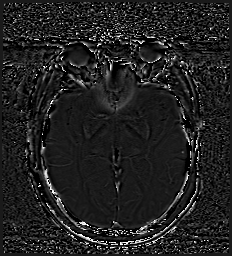
[im 45/60]
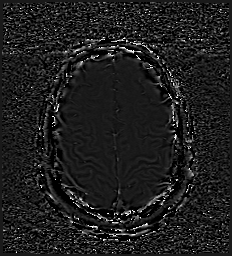
[im 60/60]
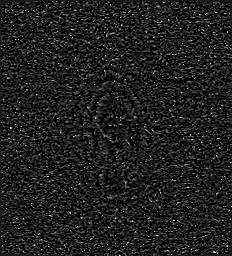

[Series 13: swi_images · axial · 3.0mm · 0.90mm/px · z∈[-100,+77]mm · 5 of 60 slices shown]
[im 1/60]
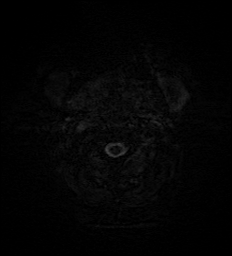
[im 15/60]
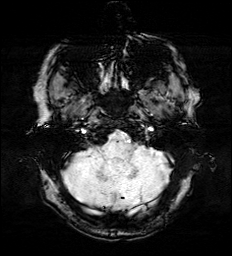
[im 30/60]
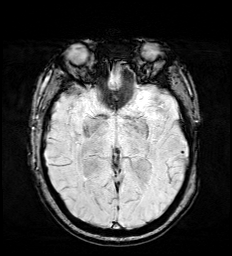
[im 45/60]
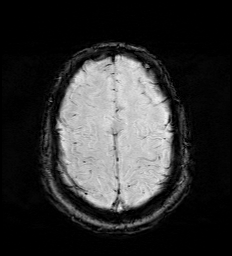
[im 60/60]
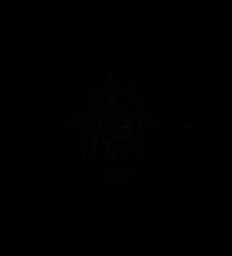

[Series 15: FLAIR · axial · 3.0mm · 0.53mm/px · z∈[-92,+69]mm · 4 of 55 slices shown]
[im 1/55]
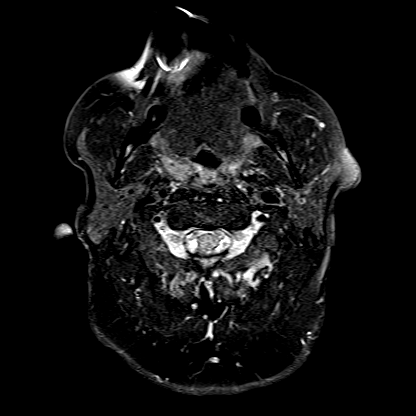
[im 19/55]
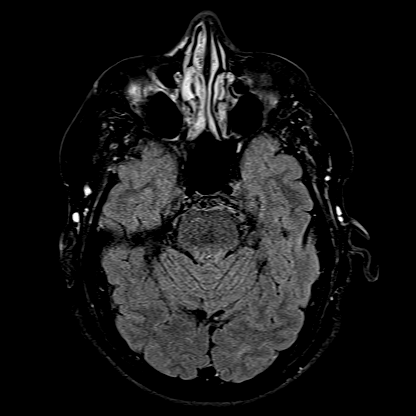
[im 37/55]
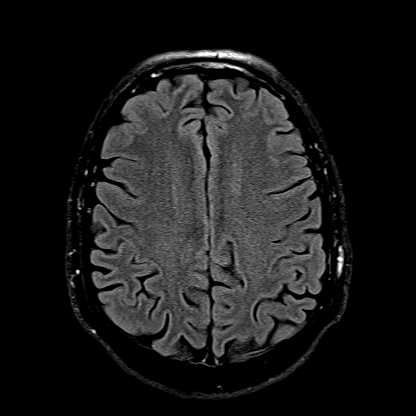
[im 55/55]
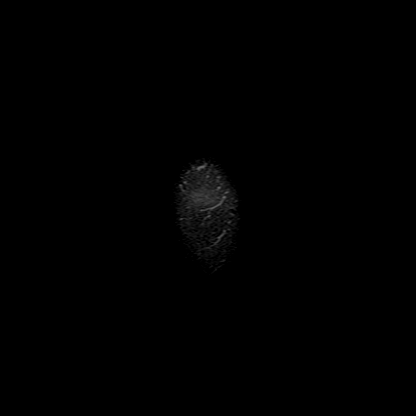

[Series 16: T1 · axial · 1.0mm · 0.98mm/px · z∈[-99,+76]mm · 11 of 176 slices shown (2 of 2)]
[im 1/176]
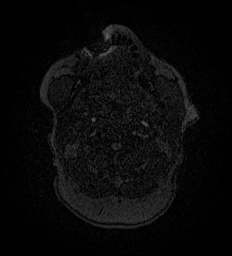
[im 14/176]
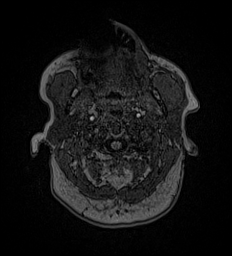
[im 27/176]
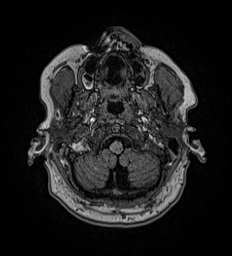
[im 41/176]
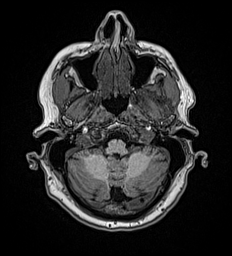
[im 54/176]
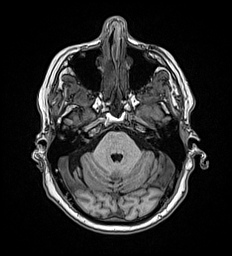
[im 68/176]
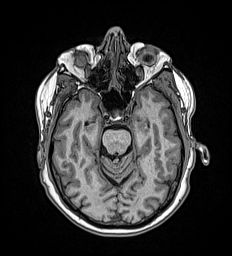
[im 81/176]
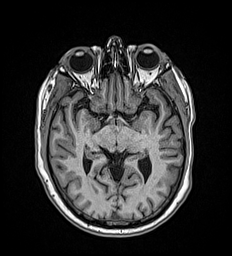
[im 95/176]
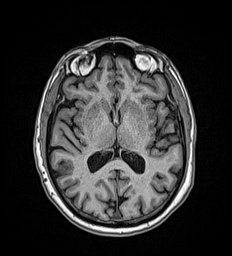
[im 122/176]
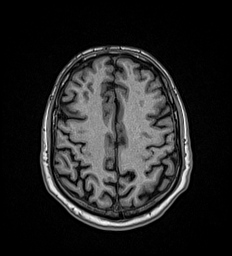
[im 149/176]
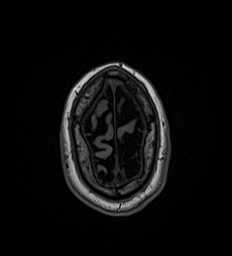
[im 176/176]
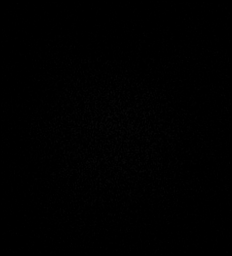

[Series 17: T2 · coronal · 5.0mm · 0.57mm/px · 2 of 30 slices shown (2 of 2)]
[im 1/30]
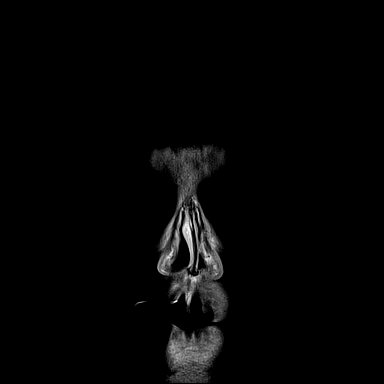
[im 30/30]
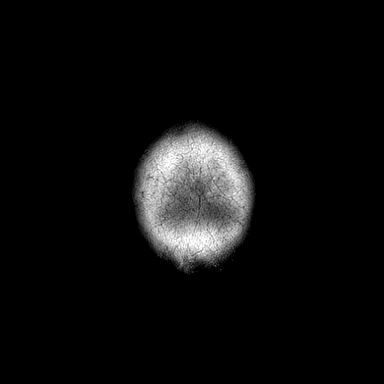

[45 of 48 positions shown; findings below may reference images not displayed]

FINDINGS: Brain: The brain has a normal appearance without evidence of
malformation, atrophy, old or acute small or large vessel
infarction, mass lesion, hemorrhage, hydrocephalus or extra-axial
collection.

Vascular: Major vessels at the base of the brain show flow. Venous
sinuses appear patent.

Skull and upper cervical spine: Normal.

Sinuses/Orbits: Clear/normal.

Other: None significant.
IMPRESSION: Normal examination. No abnormality seen to explain the presenting
symptoms.

## 2020-09-27 ENCOUNTER — Other Ambulatory Visit: Payer: Self-pay

## 2020-09-27 ENCOUNTER — Ambulatory Visit (INDEPENDENT_AMBULATORY_CARE_PROVIDER_SITE_OTHER): Payer: Medicare PPO

## 2020-09-27 ENCOUNTER — Encounter: Payer: Self-pay | Admitting: Podiatry

## 2020-09-27 ENCOUNTER — Ambulatory Visit: Payer: Medicare PPO | Admitting: Podiatry

## 2020-09-27 DIAGNOSIS — M7751 Other enthesopathy of right foot: Secondary | ICD-10-CM | POA: Diagnosis not present

## 2020-09-27 DIAGNOSIS — M7752 Other enthesopathy of left foot: Secondary | ICD-10-CM

## 2020-09-27 DIAGNOSIS — M7750 Other enthesopathy of unspecified foot: Secondary | ICD-10-CM

## 2020-09-27 MED ORDER — MELOXICAM 15 MG PO TABS: 15.0000 mg | ORAL_TABLET | Freq: Every day | ORAL | 3 refills | Status: DC

## 2020-09-27 MED ORDER — TRIAMCINOLONE ACETONIDE 40 MG/ML IJ SUSP
40.0000 mg | Freq: Once | INTRAMUSCULAR | Status: AC
  Administered 2020-09-27: 40 mg

## 2020-09-27 NOTE — Progress Notes (Signed)
  Subjective:  Patient ID: Derrick Carroll, male    DOB: April 20, 1951,  MRN: [id number] HPI Chief Complaint  Patient presents with  . Foot Pain    Dorsal midfoot/plantar forefoot bilateral - aching x years, increased activity casues extreme pain, seen Dr. Burnett Sheng few years ago-Rx'd Gabapentin 100mg -no help, sent to Dr. injections and made orthotics, also tried Voltaren gel  . New Patient (Initial Visit)    70 y.o. male presents with the above complaint.   ROS: Denies fever chills nausea vomiting muscle aches pains calf pain back pain chest pain shortness of breath.  Past Medical History:  Diagnosis Date  . BPH (benign prostatic hyperplasia)   . ED (erectile dysfunction)   . Migraine headache   . Renal cyst    No past surgical history on file.  Current Outpatient Medications:  .  meloxicam (MOBIC) 15 MG tablet, Take 1 tablet (15 mg total) by mouth daily., Disp: 30 tablet, Rfl: 3 .  finasteride (PROSCAR) 5 MG tablet, Take 1 tablet (5 mg total) by mouth daily., Disp: 90 tablet, Rfl: 0 .  magnesium oxide (MAG-OX) 400 MG tablet, Take by mouth., Disp: , Rfl:  .  Multiple Vitamin (MULTI-VITAMINS) TABS, Take by mouth., Disp: , Rfl:  .  simvastatin (ZOCOR) 20 MG tablet, TAKE 1 TABLET BY MOUTH EVERY DAY AT NIGHT, Disp: , Rfl:   Allergies  Allergen Reactions  . Chlorpheniramine     Other reaction(s): Unknown   Review of Systems Objective:  There were no vitals filed for this visit.  General: Well developed, nourished, in no acute distress, alert and oriented x3   Dermatological: Skin is warm, dry and supple bilateral. Nails x 10 are well maintained; remaining integument appears unremarkable at this time. There are no open sores, no preulcerative lesions, no rash or signs of infection present.  Vascular: Dorsalis Pedis artery and Posterior Tibial artery pedal pulses are 2/4 bilateral with immedate capillary fill time. Pedal hair growth present. No varicosities and no lower  extremity edema present bilateral.   Neruologic: Grossly intact via light touch bilateral. Vibratory intact via tuning fork bilateral. Protective threshold with Semmes Wienstein monofilament intact to all pedal sites bilateral. Patellar and Achilles deep tendon reflexes 2+ bilateral. No Babinski or clonus noted bilateral.   Musculoskeletal: No gross boney pedal deformities bilateral. No pain, crepitus, or limitation noted with foot and ankle range of motion bilateral. Muscular strength 5/5 in all groups tested bilateral.  Pain on palpation and range of motion of the midtarsal joint and pain on end range of motion of the subtalar joint.  He also has pain on palpation of the sinus tarsi bilateral. Gait: Unassisted, Nonantalgic.    Radiographs:  Radiographs taken today demonstrate pes planus with jamming of the subtalar joint and the DIP and the midtarsal joint.  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  Plan: Injected the sinus tarsi today with 20 mg Kenalog 5 mg Marcaine point maximal tenderness.  Also I will follow-up with him in with his 2 sets of orthotics next visit and I would also like to go ahead and get him started on 15 mg of meloxicam.     Max T. [place], [place]

## 2020-11-04 ENCOUNTER — Other Ambulatory Visit: Payer: Self-pay | Admitting: Urology

## 2020-11-08 ENCOUNTER — Encounter: Payer: Self-pay | Admitting: Podiatry

## 2020-11-08 ENCOUNTER — Other Ambulatory Visit: Payer: Self-pay

## 2020-11-08 ENCOUNTER — Ambulatory Visit: Payer: Medicare PPO | Admitting: Podiatry

## 2020-11-08 DIAGNOSIS — M7751 Other enthesopathy of right foot: Secondary | ICD-10-CM | POA: Diagnosis not present

## 2020-11-08 DIAGNOSIS — M7752 Other enthesopathy of left foot: Secondary | ICD-10-CM

## 2020-11-08 MED ORDER — TRIAMCINOLONE ACETONIDE 40 MG/ML IJ SUSP
20.0000 mg | Freq: Once | INTRAMUSCULAR | Status: AC
  Administered 2020-11-08: 40 mg

## 2020-11-08 NOTE — Progress Notes (Signed)
He presents today for follow-up of his subtalar joint capsulitis bilateral.  He states that they are feeling much better they were nearly 100% but have regressed about 75% and seem to be sitting there.  He states that he has not purchased new shoes yet.  Objective: Vital signs are stable alert oriented x3 pulses are palpable.  He has his old orthotics with him today which I evaluate they seem to be in good condition.  He has mild tenderness on palpation of the sinus tarsi's bilaterally.  Some tenderness on end range of motion.  Assessment: Subtalar joint capsulitis bilateral.  Plan: I injected the subtalar joints today bilaterally with 10 mg Kenalog 5 mg Marcaine once again.  Discussed appropriate shoe gear and orthotics follow-up with him in 1 month if necessary.

## 2020-11-09 ENCOUNTER — Other Ambulatory Visit: Payer: Self-pay | Admitting: Urology

## 2021-01-15 ENCOUNTER — Other Ambulatory Visit: Payer: Self-pay

## 2021-01-15 ENCOUNTER — Encounter: Payer: Self-pay | Admitting: Podiatry

## 2021-01-15 ENCOUNTER — Ambulatory Visit: Payer: Medicare PPO | Admitting: Podiatry

## 2021-01-15 DIAGNOSIS — M7752 Other enthesopathy of left foot: Secondary | ICD-10-CM | POA: Diagnosis not present

## 2021-01-15 DIAGNOSIS — M7751 Other enthesopathy of right foot: Secondary | ICD-10-CM

## 2021-01-15 MED ORDER — TRIAMCINOLONE ACETONIDE 40 MG/ML IJ SUSP
40.0000 mg | Freq: Once | INTRAMUSCULAR | Status: AC
  Administered 2021-01-15: 40 mg

## 2021-01-15 NOTE — Progress Notes (Signed)
He presents today for follow-up of his subtalar joint capsulitis.  The last time he was and he had related that he had initially been 100% better and then regressed to 75% and was holding there.  He states that after the last injection which took several days to work he progressed again and is now 80 to 85% improved from his initial evaluation.  Objective: Vital signs are stable he is alert and oriented x3 pulses are palpable.  Still has tenderness on palpation of the sinus tarsi bilateral no pain on range of motion but tenderness on end range of motion right foot more so than the left.  Mild tenderness on palpation of the sinus tarsi.  Assessment: Sinus tarsitis capsulitis of the subtalar joint bilateral right greater than left it appears.  Plan: At this point I reinjected another 10 mg into the sinus tarsi today bilaterally after Betadine skin prep he tolerated procedure well without complications.  We did spend quite a bit of time discussing appropriate shoes and boots he understands this and is amendable to it we will follow-up with him in the near future.  May need to consider something simple like a power step.

## 2021-02-28 ENCOUNTER — Encounter: Payer: Self-pay | Admitting: Podiatry

## 2021-02-28 ENCOUNTER — Other Ambulatory Visit: Payer: Self-pay

## 2021-02-28 ENCOUNTER — Ambulatory Visit: Payer: Medicare PPO | Admitting: Podiatry

## 2021-02-28 DIAGNOSIS — M7752 Other enthesopathy of left foot: Secondary | ICD-10-CM | POA: Diagnosis not present

## 2021-02-28 MED ORDER — DEXAMETHASONE SODIUM PHOSPHATE 120 MG/30ML IJ SOLN
2.0000 mg | Freq: Once | INTRAMUSCULAR | Status: AC
  Administered 2021-02-28: 2 mg via INTRA_ARTICULAR

## 2021-02-28 NOTE — Progress Notes (Signed)
He presents today for follow-up of his subtalar joint capsulitis which she states has been doing just fine.  He states that the outside of his fifth metatarsal phalangeal joint has been a little sore.  States that he gets swelled up and red.  He is questioning whether or not he may have had gout in it.  Objective: Does demonstrate some postinflammatory hyperpigmentation surrounding the joint.  Very much like gout does.  It is nontender on palpation plantarly though it is tender dorsally laterally and medially.  Assessment: Capsulitis fifth metatarsophalangeal joint left foot.  Plan: I injected the area today with dexamethasone local anesthetic.  I explained to him should this ever happen again he is to call and get in here immediately for blood work.

## 2021-03-01 ENCOUNTER — Other Ambulatory Visit: Payer: Self-pay | Admitting: Podiatry

## 2021-03-01 NOTE — Telephone Encounter (Signed)
Please advise 

## 2021-05-30 ENCOUNTER — Ambulatory Visit: Payer: Medicare PPO | Admitting: Podiatry

## 2021-05-31 ENCOUNTER — Encounter: Payer: Self-pay | Admitting: Urology

## 2021-05-31 ENCOUNTER — Ambulatory Visit: Payer: Medicare PPO | Admitting: Urology

## 2021-05-31 ENCOUNTER — Other Ambulatory Visit: Payer: Self-pay

## 2021-05-31 VITALS — BP 183/103 | HR 59 | Ht 69.0 in | Wt 183.0 lb

## 2021-05-31 DIAGNOSIS — N401 Enlarged prostate with lower urinary tract symptoms: Secondary | ICD-10-CM | POA: Diagnosis not present

## 2021-05-31 DIAGNOSIS — N5201 Erectile dysfunction due to arterial insufficiency: Secondary | ICD-10-CM

## 2021-05-31 LAB — BLADDER SCAN AMB NON-IMAGING: Scan Result: 24

## 2021-05-31 NOTE — Progress Notes (Signed)
05/31/2021 9:15 AM   Derrick Carroll 12/23/1950 [id number]  Referring provider: Jerl Mina, MD [address] [place],  [place] [address]  Chief Complaint  Patient presents with   Benign Prostatic Hypertrophy    Urologic history:  1.  BPH TURP Dr. [name] 2017 finasteride 5 mg daily   2.  Erectile dysfunction Generic sildenafil   3.  Renal cysts Bilateral simple renal cysts               HPI: 70 y.o. male presents for annual follow-up.  Doing well since last visit No bothersome LUTS Denies dysuria, gross hematuria Denies flank, abdominal or pelvic pain Remains on finasteride Sildenafil continues to be effective for his ED PSA performed PCP 05/16/2021 stable 0.16   PMH: Past Medical History:  Diagnosis Date   BPH (benign prostatic hyperplasia)    ED (erectile dysfunction)    Migraine headache    Renal cyst     Surgical History: History reviewed. No pertinent surgical history.  Home Medications:  Allergies as of 05/31/2021       Reactions   Chlorpheniramine    Other reaction(s): Unknown        Medication List        Accurate as of May 31, 2021  9:15 AM. If you have any questions, ask your nurse or doctor.          finasteride 5 MG tablet Commonly known as: PROSCAR TAKE 1 TABLET BY MOUTH EVERY DAY   magnesium oxide 400 MG tablet Commonly known as: MAG-OX Take by mouth.   meloxicam 15 MG tablet Commonly known as: MOBIC TAKE 1 TABLET (15 MG TOTAL) BY MOUTH DAILY.   Multi-Vitamins Tabs Take by mouth.   nortriptyline 10 MG capsule Commonly known as: PAMELOR Take by mouth.   sildenafil 20 MG tablet Commonly known as: REVATIO 3 TO 5 TABLETS BY MOUTH EVERY DAY AS NEEDED   simvastatin 20 MG tablet Commonly known as: ZOCOR TAKE 1 TABLET BY MOUTH EVERY DAY AT NIGHT   SUMAtriptan 100 MG tablet Commonly known as: IMITREX Take by mouth.   Ubrelvy 100 MG Tabs Generic drug: Ubrogepant Take by mouth.         Allergies:  Allergies  Allergen Reactions   Chlorpheniramine     Other reaction(s): Unknown    Family History: History reviewed. No pertinent family history.  Social History:  reports that he quit smoking about 47 years ago. His smoking use included cigarettes. He has never used smokeless tobacco. He reports that he does not currently use alcohol. He reports that he does not use drugs.   Physical Exam: BP (!) 183/103   Pulse (!) 59   Ht 5\' 9"  (1.753 m)   Wt 183 lb (83 kg)   BMI 27.02 kg/m   Constitutional:  Alert and oriented, No acute distress. HEENT: [place] AT, moist mucus membranes.  Trachea midline, no masses. Cardiovascular: No clubbing, cyanosis, or edema. Respiratory: Normal respiratory effort, no increased work of breathing. GI: Abdomen is soft, nontender, nondistended, no abdominal masses GU: Prostate 40 g, smooth without nodules Psychiatric: Normal mood and affect.    Assessment & Plan:    1. Benign prostatic hyperplasia with LUTS  Stable Bladder scan PVR 24 mL Continue finasteride  2.  Erectile dysfunction Stable Continue sildenafil as needed   Continue annual follow-up    , MD  [facility] [address] [place], [place] [phone]  [phone]   

## 2021-06-01 LAB — URINALYSIS, COMPLETE
Bilirubin, UA: NEGATIVE
Glucose, UA: NEGATIVE
Ketones, UA: NEGATIVE
Leukocytes,UA: NEGATIVE
Nitrite, UA: NEGATIVE
Protein,UA: NEGATIVE
Specific Gravity, UA: 1.015 (ref 1.005–1.030)
Urobilinogen, Ur: 0.2 mg/dL (ref 0.2–1.0)
pH, UA: 5.5 (ref 5.0–7.5)

## 2021-06-01 LAB — MICROSCOPIC EXAMINATION
Bacteria, UA: NONE SEEN
Epithelial Cells (non renal): NONE SEEN /hpf (ref 0–10)
RBC, Urine: NONE SEEN /hpf (ref 0–2)
WBC, UA: NONE SEEN /hpf (ref 0–5)

## 2021-06-06 ENCOUNTER — Other Ambulatory Visit: Payer: Self-pay

## 2021-06-06 ENCOUNTER — Encounter: Payer: Self-pay | Admitting: Podiatry

## 2021-06-06 ENCOUNTER — Ambulatory Visit: Payer: Medicare PPO | Admitting: Podiatry

## 2021-06-06 DIAGNOSIS — M10071 Idiopathic gout, right ankle and foot: Secondary | ICD-10-CM | POA: Diagnosis not present

## 2021-06-06 DIAGNOSIS — M7751 Other enthesopathy of right foot: Secondary | ICD-10-CM

## 2021-06-06 NOTE — Progress Notes (Signed)
He presents today for follow-up of his capsulitis of his ankle and fifth metatarsal phalangeal joint.  He states that the left foot is doing fine but the right foot is bothersome at the ankle is really not bother me today but it has had 2 events where it was red hot and swollen and I can barely wear bear weight on it.  Denies any trauma to it denies any change in his past medical history medications or allergies.  Objective: Vital signs are stable alert and oriented x3.  Pulses are palpable.  No swelling erythema cellulitis drainage or odor no pain on range of motion or palpation to the area in question.  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  Plan: I recommended that the next time this happens he is to follow-up with [place] immediately the same day to request blood work consisting of an arthritic profile and a CBC.  Arthritic profile consistent with a sed rate, CRP uric acid RA titer and an ANA

## 2021-06-28 ENCOUNTER — Telehealth: Payer: Self-pay | Admitting: *Deleted

## 2021-06-28 DIAGNOSIS — M05772 Rheumatoid arthritis with rheumatoid factor of left ankle and foot without organ or systems involvement: Secondary | ICD-10-CM

## 2021-06-28 DIAGNOSIS — M05771 Rheumatoid arthritis with rheumatoid factor of right ankle and foot without organ or systems involvement: Secondary | ICD-10-CM

## 2021-06-28 NOTE — Telephone Encounter (Signed)
Patient came by to pick up a lab requisition.  He said Dr. Al Corpus told him to stop by the office to get the form whenever he had a flare-up.  I gave him a lab requisition for an Arthritic Panel and a CBC per Dr. Al Corpus.

## 2021-06-29 ENCOUNTER — Telehealth: Payer: Self-pay | Admitting: *Deleted

## 2021-06-29 DIAGNOSIS — M05771 Rheumatoid arthritis with rheumatoid factor of right ankle and foot without organ or systems involvement: Secondary | ICD-10-CM

## 2021-06-29 DIAGNOSIS — R768 Other specified abnormal immunological findings in serum: Secondary | ICD-10-CM

## 2021-06-29 LAB — CBC WITH DIFFERENTIAL/PLATELET
Basophils Absolute: 0.1 10*3/uL (ref 0.0–0.2)
Basos: 1 %
EOS (ABSOLUTE): 0.2 10*3/uL (ref 0.0–0.4)
Eos: 3 %
Hematocrit: 47.5 % (ref 37.5–51.0)
Hemoglobin: 15.9 g/dL (ref 13.0–17.7)
Immature Grans (Abs): 0 10*3/uL (ref 0.0–0.1)
Immature Granulocytes: 0 %
Lymphocytes Absolute: 1.8 10*3/uL (ref 0.7–3.1)
Lymphs: 22 %
MCH: 31.9 pg (ref 26.6–33.0)
MCHC: 33.5 g/dL (ref 31.5–35.7)
MCV: 95 fL (ref 79–97)
Monocytes Absolute: 0.7 10*3/uL (ref 0.1–0.9)
Monocytes: 9 %
Neutrophils Absolute: 5 10*3/uL (ref 1.4–7.0)
Neutrophils: 65 %
Platelets: 232 10*3/uL (ref 150–450)
RBC: 4.98 x10E6/uL (ref 4.14–5.80)
RDW: 11.8 % (ref 11.6–15.4)
WBC: 7.8 10*3/uL (ref 3.4–10.8)

## 2021-06-29 LAB — RHEUMATOID FACTOR: Rheumatoid fact SerPl-aCnc: 14.5 IU/mL — ABNORMAL HIGH (ref <14.0)

## 2021-06-29 LAB — URIC ACID: Uric Acid: 4 mg/dL (ref 3.8–8.4)

## 2021-06-29 LAB — ANA: Anti Nuclear Antibody (ANA): POSITIVE — AB

## 2021-06-29 LAB — C-REACTIVE PROTEIN: CRP: 1 mg/L (ref 0–10)

## 2021-06-29 LAB — SEDIMENTATION RATE: Sed Rate: 5 mm/hr (ref 0–30)

## 2021-06-29 NOTE — Telephone Encounter (Signed)
"  I was calling about the blood work I had done at [facility] yesterday.  My foot is swollen up again.  Dr. Al Corpus had asked me to do that.  Do you have the results in yet?"

## 2021-07-02 ENCOUNTER — Telehealth: Payer: Self-pay | Admitting: *Deleted

## 2021-07-02 NOTE — Telephone Encounter (Signed)
"  I called in Friday, about mid day, and left a message.  I had blood work done at [facility] on Thursday.  I want to know if the results are back, if I'm going to get any medication for it.  Nobody has called me back.  I stayed on the phone 10 to 15 minutes each time trying to get the desk.  Nobody ever answered.  I called today and I was put on hold for 10 minutes.  So, I decided to leave another message."

## 2021-07-02 NOTE — Telephone Encounter (Signed)
Can you review bloodwork results and let me know what I need to tell the patient? Thank you!

## 2021-07-03 ENCOUNTER — Telehealth: Payer: Self-pay | Admitting: Podiatry

## 2021-07-03 NOTE — Addendum Note (Signed)
Addended by: Lottie Rater E on: 07/03/2021 11:23 AM   Modules accepted: Orders

## 2021-07-03 NOTE — Telephone Encounter (Signed)
Can you let patient know that labs indicate possible rheumatoid arthritis and we will send in a referral to Rheumatology. They will call him to schedule.

## 2021-07-03 NOTE — Telephone Encounter (Signed)
Patient called stating he had labwork done on Friday to check if he had gout. Patient called asking for results.Pt's number is [phone].

## 2021-07-03 NOTE — Telephone Encounter (Signed)
Called pt relayed message that Dr Illene Regulus reviewed labwork and is sending pt to rheumotolgy. They will be reaching out to the patient.

## 2021-07-03 NOTE — Telephone Encounter (Signed)
Referral ordered for Rheumatoloy

## 2021-07-03 NOTE — Telephone Encounter (Signed)
I am returning  your call.  I apologize for the late response.  Dr.Hyatt has been out of the office.  He wanted me to let you know that your labwork shows an indication of Rheumatoid Arthritis.  He said he's going to refer you to a Rheumatologist.  "Will they call me to schedule an appointment?"  Yes, they will call you to schedule an appointment.

## 2021-07-11 ENCOUNTER — Other Ambulatory Visit: Payer: Self-pay | Admitting: Urology

## 2021-07-12 ENCOUNTER — Telehealth: Payer: Self-pay | Admitting: *Deleted

## 2021-07-12 DIAGNOSIS — M05771 Rheumatoid arthritis with rheumatoid factor of right ankle and foot without organ or systems involvement: Secondary | ICD-10-CM

## 2021-07-12 DIAGNOSIS — R768 Other specified abnormal immunological findings in serum: Secondary | ICD-10-CM

## 2021-07-12 DIAGNOSIS — M05772 Rheumatoid arthritis with rheumatoid factor of left ankle and foot without organ or systems involvement: Secondary | ICD-10-CM

## 2021-07-12 DIAGNOSIS — R7689 Other specified abnormal immunological findings in serum: Secondary | ICD-10-CM

## 2021-07-12 NOTE — Telephone Encounter (Signed)
"  I was supposed to have Rheumatology appointment scheduled.  They finally called me yesterday.  It's going to be in [place].  I didn't choose to go to [place].  I want to know if Dr. Al Corpus can refer me to [facility] at theirs."

## 2021-07-13 NOTE — Addendum Note (Signed)
Addended by: Lottie Rater E on: 07/13/2021 01:36 PM   Modules accepted: Orders

## 2021-07-13 NOTE — Telephone Encounter (Signed)
Referral sent to [facility]. Facility will contact patient for the appointment.

## 2021-07-17 ENCOUNTER — Telehealth: Payer: Self-pay | Admitting: Podiatry

## 2021-07-17 NOTE — Telephone Encounter (Signed)
Referral was sent in last week to [facility]. Joni Reining let the patient know

## 2021-07-17 NOTE — Telephone Encounter (Signed)
Patient called he was referred to rheumatology. Patient is calling stating he wants to see rheumatology at [facility]. Please call patient at [phone]. Cell [phone].Patient states he does not want to go to [place].

## 2021-07-18 NOTE — Telephone Encounter (Signed)
Patient called and set up appointment for rheumatology.with [facility].

## 2021-08-08 ENCOUNTER — Other Ambulatory Visit: Payer: Self-pay | Admitting: Podiatry

## 2021-08-15 ENCOUNTER — Emergency Department
Admission: EM | Admit: 2021-08-15 | Discharge: 2021-08-15 | Disposition: A | Discharge status: Home / Self Care | Payer: Medicare PPO | Attending: Emergency Medicine | Admitting: Emergency Medicine

## 2021-08-15 ENCOUNTER — Other Ambulatory Visit: Payer: Self-pay

## 2021-08-15 ENCOUNTER — Emergency Department: Payer: Medicare PPO

## 2021-08-15 ENCOUNTER — Encounter: Payer: Self-pay | Admitting: Emergency Medicine

## 2021-08-15 DIAGNOSIS — I10 Essential (primary) hypertension: Secondary | ICD-10-CM | POA: Insufficient documentation

## 2021-08-15 DIAGNOSIS — R42 Dizziness and giddiness: Secondary | ICD-10-CM | POA: Insufficient documentation

## 2021-08-15 DIAGNOSIS — R0602 Shortness of breath: Secondary | ICD-10-CM | POA: Diagnosis not present

## 2021-08-15 DIAGNOSIS — R911 Solitary pulmonary nodule: Secondary | ICD-10-CM | POA: Insufficient documentation

## 2021-08-15 DIAGNOSIS — R002 Palpitations: Secondary | ICD-10-CM | POA: Insufficient documentation

## 2021-08-15 DIAGNOSIS — F0390 Unspecified dementia without behavioral disturbance: Secondary | ICD-10-CM | POA: Diagnosis not present

## 2021-08-15 LAB — CBC
HCT: 46.7 % (ref 39.0–52.0)
Hemoglobin: 15.6 g/dL (ref 13.0–17.0)
MCH: 32 pg (ref 26.0–34.0)
MCHC: 33.4 g/dL (ref 30.0–36.0)
MCV: 95.9 fL (ref 80.0–100.0)
Platelets: 255 10*3/uL (ref 150–400)
RBC: 4.87 MIL/uL (ref 4.22–5.81)
RDW: 12.5 % (ref 11.5–15.5)
WBC: 5.3 10*3/uL (ref 4.0–10.5)
nRBC: 0 % (ref 0.0–0.2)

## 2021-08-15 LAB — D-DIMER, QUANTITATIVE: D-Dimer, Quant: 0.36 ug/mL-FEU (ref 0.00–0.50)

## 2021-08-15 LAB — BASIC METABOLIC PANEL
Anion gap: 4 — ABNORMAL LOW (ref 5–15)
BUN: 17 mg/dL (ref 8–23)
CO2: 26 mmol/L (ref 22–32)
Calcium: 8.9 mg/dL (ref 8.9–10.3)
Chloride: 105 mmol/L (ref 98–111)
Creatinine, Ser: 0.93 mg/dL (ref 0.61–1.24)
GFR, Estimated: >60 mL/min (ref >60)
Glucose, Bld: 107 mg/dL — ABNORMAL HIGH (ref 70–99)
Potassium: 3.8 mmol/L (ref 3.5–5.1)
Sodium: 135 mmol/L (ref 135–145)

## 2021-08-15 LAB — BRAIN NATRIURETIC PEPTIDE: B Natriuretic Peptide: 151.9 pg/mL — ABNORMAL HIGH (ref 0.0–100.0)

## 2021-08-15 LAB — TROPONIN I (HIGH SENSITIVITY): Troponin I (High Sensitivity): 7 ng/L (ref <18)

## 2021-08-15 LAB — MAGNESIUM: Magnesium: 2 mg/dL (ref 1.7–2.4)

## 2021-08-15 LAB — TSH: TSH: 2.242 u[IU]/mL (ref 0.350–4.500)

## 2021-08-15 NOTE — ED Notes (Signed)
Check Pulse Oximetry while ambulating was at 100%

## 2021-08-15 NOTE — ED Triage Notes (Signed)
Pt comes into the ED via [facility] clinic for chest fluttering and new lightheadedness when taking deep breaths.  Pt denies any known cardiac history.  Pt denies any current pain, just states it is a fluttering sensation.  Pt in has even and unlabored respirations at this time.

## 2021-08-15 NOTE — ED Provider Notes (Addendum)
[facility] Provider Note    Event Date/Time   First MD Initiated Contact with Patient 08/15/21 [phone]     (approximate)   History   Palpitations and Dizziness   HPI  Derrick Carroll is a 71 y.o. male with a past medical history of migraine headaches on nortriptyline and Ubrelvy, sensorineural hearing loss, some memory impairment with family history of dementia, known left cyst on the kidney, bilateral inguinal hernias and BPH who presents for assessment of 3 days of lightheadedness and palpitations with deep breathing.  Patient adamantly denies any chest pain, back pain, cough, fevers, nausea, vomiting, diarrhea, urinary symptoms, headache, earache, sore throat, change in chronic blurriness he has been lithiasis ongoing for some time right vertigo.  He denies any new medication changes, significant EtOH use or illicit drug use.  He has no known cardiac or pulmonary history.  He has not had any syncope or loss of consciousness episodes.      Physical Exam  Triage Vital Signs: ED Triage Vitals [08/15/21 0819]  Enc Vitals Group     BP      Pulse      Resp      Temp      Temp src      SpO2      Weight 182 lb 15.7 oz (83 kg)     Height 5\' 9"  (1.753 m)     Head Circumference      Peak Flow      Pain Score 0     Pain Loc      Pain Edu?      Excl. in GC?     Most recent vital signs: Vitals:   08/15/21 1018 08/15/21 1022  BP: (!) 142/96 (!) 165/102  Pulse: 65 82  Resp: 17 19  Temp:    SpO2: 100% 100%    General: Awake, no distress.  CV:  Good peripheral perfusion.  No murmurs rubs or gallops.  2+ radial pulses.  Extremities seem warm and well-perfused. Resp:  Normal effort.  Clear bilaterally. Abd:  No distention.  Soft throughout. Other:  No significant lower extremity edema.   ED Results / Procedures / Treatments  Labs (all labs ordered are listed, but only abnormal results are displayed) Labs Reviewed  BASIC METABOLIC PANEL - Abnormal;  Notable for the following components:      Result Value   Glucose, Bld 107 (*)    Anion gap 4 (*)    All other components within normal limits  BRAIN NATRIURETIC PEPTIDE - Abnormal; Notable for the following components:   B Natriuretic Peptide 151.9 (*)    All other components within normal limits  CBC  TSH  MAGNESIUM  D-DIMER, QUANTITATIVE  TROPONIN I (HIGH SENSITIVITY)     EKG  EKG remarkable for sinus rhythm with a ventricular rate of 63, normal axis, unremarkable intervals with nonspecific ST change in lead III and V3 without other clear evidence of acute ischemia or significant arrhythmia.   RADIOLOGY   Chest x-ray reviewed by myself shows no focal consolidation, effusion, edema, pneumothorax or other clear acute thoracic process.  I also reviewed radiology interpretation and agree with their findings.  Radiology does make note of a possible right upper lobe small lung nodule that can be followed up nonemergent outpatient.   PROCEDURES:  Critical Care performed: No  Procedures    MEDICATIONS ORDERED IN ED: Medications - No data to display   IMPRESSION / MDM / ASSESSMENT  AND PLAN / ED COURSE  I reviewed the triage vital signs and the nursing notes.                              Differential diagnosis includes, but is not limited to dehydration, metabolic derangements, anemia, ACS, PE, bronchitis with a lower suspicion at this time based on his history exam for CVA, dissection, toxic ingestion or preceding trauma.  I did review patient's recent ED visits including neurology visit from 12/13 and rheumatology clinic visit from earlier today.  EKG remarkable for sinus rhythm with a ventricular rate of 63, normal axis, unremarkable intervals with nonspecific ST change in lead III and V3 without other clear evidence of acute ischemia or significant arrhythmia.  Chest x-ray reviewed by myself shows no focal consolidation, effusion, edema, pneumothorax or other clear  acute thoracic process.  I also reviewed radiology interpretation and agree with their findings.  Radiology does make note of a possible right upper lobe small lung nodule that can be followed up nonemergent outpatient.  BMP shows no significant electrolyte or metabolic derangements.  Magnesium is within normal limits.  TSH is within normal limits.  CBC shows no leukocytosis or acute anemia.  D-dimer is 0.36 and not suggestive of PE or other acute thrombotic process.  BNP slightly above upper normal at 151 although overall patient does not appear grossly volume overloaded and I have a low suspicion for acute CHF at this time.  Troponin is 7 and given otherwise relatively reassuring EKG with symptoms going on over last couple days I have a low suspicion for ACS or myocarditis.  Patient was ambulated approximately 200 feet around the emergency room and had no evidence of hypoxia or recurrence of symptoms.  Given stable vitals otherwise reassuring exam work-up I have a low suspicion for immediate life-threatening pathology.  I think patient is stable at this time for continued outpatient evaluation.  I discussed with patient incidental finding of lung nodule on chest x-ray and recommendation for nonemergent outpatient follow-up imaging to be coordinated by his PCP.  Also advised patient that his blood pressure was elevated today and patient states this is always the case when he is in the hospital or doctors office but that he keeps a home log and is not elevated at home.  He will follow up with his PCP as well.  He is amenable to this plan.  Discharged stable condition.  Strict return precautions advised and discussed.      FINAL CLINICAL IMPRESSION(S) / ED DIAGNOSES   Final diagnoses:  Palpitations  Dizziness  Lung nodule  Hypertension, unspecified type     Rx / DC Orders   ED Discharge Orders     None        Note:  This document was prepared using Dragon voice recognition software and  may include unintentional dictation errors.   Gilles Chiquito, MD 08/15/21 1024    Gilles Chiquito, MD 08/15/21 1027    Gilles Chiquito, MD 08/15/21 1027

## 2021-08-15 NOTE — ED Notes (Signed)
Pt to ED for palpitations and feeling lightheaded and "something feels different" in chest since 3 days ago. He feels faint, lightheaded with deep inspiration. Denies NVD, denies vision or balance changes. Denies SOB and dyspnea with exertion.

## 2021-10-22 ENCOUNTER — Other Ambulatory Visit: Payer: Self-pay | Admitting: Urology

## 2021-12-12 ENCOUNTER — Other Ambulatory Visit: Payer: Self-pay | Admitting: Urology

## 2021-12-25 ENCOUNTER — Other Ambulatory Visit: Payer: Self-pay | Admitting: Podiatry

## 2022-01-21 ENCOUNTER — Other Ambulatory Visit: Payer: Self-pay | Admitting: Urology

## 2022-05-10 ENCOUNTER — Other Ambulatory Visit: Payer: Self-pay | Admitting: Podiatry

## 2022-05-21 ENCOUNTER — Other Ambulatory Visit: Payer: Self-pay | Admitting: Podiatry

## 2022-05-21 NOTE — Telephone Encounter (Signed)
Can this be sent? 

## 2022-05-21 NOTE — Telephone Encounter (Signed)
Patient is calling for a refill on his meloxicam, has not been seen since 12/2020, still having inflammation,please schedule for a medication f/u appointment.

## 2022-05-27 NOTE — Telephone Encounter (Signed)
Left message with wife for patient to call back and schedule appointment.

## 2022-05-31 ENCOUNTER — Encounter: Payer: Self-pay | Admitting: Urology

## 2022-05-31 ENCOUNTER — Ambulatory Visit: Payer: Medicare PPO | Admitting: Urology

## 2022-05-31 VITALS — BP 151/93 | HR 52 | Ht 69.0 in | Wt 179.0 lb

## 2022-05-31 DIAGNOSIS — N401 Enlarged prostate with lower urinary tract symptoms: Secondary | ICD-10-CM

## 2022-05-31 LAB — URINALYSIS, COMPLETE
Bilirubin, UA: NEGATIVE
Glucose, UA: NEGATIVE
Ketones, UA: NEGATIVE
Leukocytes,UA: NEGATIVE
Nitrite, UA: NEGATIVE
Protein,UA: NEGATIVE
RBC, UA: NEGATIVE
Specific Gravity, UA: 1.025 (ref 1.005–1.030)
Urobilinogen, Ur: 0.2 mg/dL (ref 0.2–1.0)
pH, UA: 6 (ref 5.0–7.5)

## 2022-05-31 LAB — BLADDER SCAN AMB NON-IMAGING: Scan Result: 1

## 2022-05-31 LAB — MICROSCOPIC EXAMINATION: Bacteria, UA: NONE SEEN

## 2022-05-31 MED ORDER — FINASTERIDE 5 MG PO TABS: 5.0000 mg | ORAL_TABLET | Freq: Every day | ORAL | 1 refills | Status: DC

## 2022-05-31 NOTE — Progress Notes (Unsigned)
   05/31/2022 10:31 AM   Bertrum Roddie Mc 1951-01-17 [id number]  Referring provider: Maryland Pink, MD [address] [place],  [place] [address]  Chief Complaint  Patient presents with   Follow-up    Urologic history:  1.  BPH TURP Dr. Jacqlyn Larsen 2017 finasteride 5 mg daily   2.  Erectile dysfunction Generic sildenafil   3.  Renal cysts Bilateral simple renal cysts               HPI: 71 y.o. male presents for annual follow-up.  Doing well since last visit No bothersome LUTS; IPSS 4/35 Denies dysuria, gross hematuria Denies flank, abdominal or pelvic pain Remains on finasteride Sildenafil prn PSA performed PCP 05/28/2022 stable 0.16   PMH: Past Medical History:  Diagnosis Date   BPH (benign prostatic hyperplasia)    ED (erectile dysfunction)    Migraine headache    Renal cyst     Surgical History: No past surgical history on file.  Home Medications:  Allergies as of 05/31/2022       Reactions   Chlorpheniramine    Other reaction(s): Unknown        Medication List        Accurate as of May 31, 2022 10:31 AM. If you have any questions, ask your nurse or doctor.          Cholecalciferol 25 MCG (1000 UT) tablet Take by mouth.   finasteride 5 MG tablet Commonly known as: PROSCAR TAKE 1 TABLET BY MOUTH EVERY DAY   magnesium oxide 400 MG tablet Commonly known as: MAG-OX Take by mouth.   meloxicam 15 MG tablet Commonly known as: MOBIC TAKE 1 TABLET (15 MG TOTAL) BY MOUTH DAILY.   Multi-Vitamins Tabs Take by mouth.   nortriptyline 10 MG capsule Commonly known as: PAMELOR Take by mouth.   sildenafil 20 MG tablet Commonly known as: REVATIO TAKE 3-5 TABLETS BY MOUTH EVERY DAY AS NEEDED   simvastatin 20 MG tablet Commonly known as: ZOCOR TAKE 1 TABLET BY MOUTH EVERY DAY AT NIGHT   simvastatin 20 MG tablet Commonly known as: ZOCOR Take 1 tablet by mouth at bedtime.   SUMAtriptan 100 MG tablet Commonly known as:  IMITREX Take by mouth.   Ubrelvy 100 MG Tabs Generic drug: Ubrogepant Take by mouth.        Allergies:  Allergies  Allergen Reactions   Chlorpheniramine     Other reaction(s): Unknown    Family History: No family history on file.  Social History:  reports that he quit smoking about 48 years ago. His smoking use included cigarettes. He has never used smokeless tobacco. He reports that he does not currently use alcohol. He reports that he does not use drugs.   Physical Exam: BP (!) 151/93   Pulse (!) 52   Ht 5\' 9"  (1.753 m)   Wt 179 lb (81.2 kg)   BMI 26.43 kg/m   Constitutional:  Alert and oriented, No acute distress. HEENT: [place] AT Respiratory: Normal respiratory effort, no increased work of breathing. Psychiatric: Normal mood and affect.    Assessment & Plan:    1. Benign prostatic hyperplasia with LUTS  Stable Bladder scan PVR 1 mL Continue finasteride-refill sent    Continue annual follow-up    Abbie Sons, MD  [facility] [address] [place], [place] [address] [phone]

## 2022-06-02 ENCOUNTER — Encounter: Payer: Self-pay | Admitting: Urology

## 2022-12-10 ENCOUNTER — Other Ambulatory Visit: Payer: Self-pay | Admitting: Urology

## 2023-02-01 ENCOUNTER — Other Ambulatory Visit: Payer: Self-pay | Admitting: Urology

## 2023-04-21 ENCOUNTER — Other Ambulatory Visit: Payer: Self-pay | Admitting: Student

## 2023-04-21 DIAGNOSIS — G43019 Migraine without aura, intractable, without status migrainosus: Secondary | ICD-10-CM

## 2023-04-23 ENCOUNTER — Ambulatory Visit
Admission: RE | Admit: 2023-04-23 | Discharge: 2023-04-23 | Disposition: A | Discharge status: Home / Self Care | Payer: Medicare PPO | Source: Ambulatory Visit | Attending: Student | Admitting: Student

## 2023-04-23 DIAGNOSIS — G43019 Migraine without aura, intractable, without status migrainosus: Secondary | ICD-10-CM | POA: Diagnosis present

## 2023-06-04 ENCOUNTER — Ambulatory Visit: Payer: Medicare PPO | Admitting: Urology

## 2023-06-04 ENCOUNTER — Encounter: Payer: Self-pay | Admitting: Urology

## 2023-06-04 VITALS — BP 141/96 | HR 55 | Ht 70.0 in | Wt 173.0 lb

## 2023-06-04 DIAGNOSIS — R3129 Other microscopic hematuria: Secondary | ICD-10-CM | POA: Diagnosis not present

## 2023-06-04 DIAGNOSIS — N401 Enlarged prostate with lower urinary tract symptoms: Secondary | ICD-10-CM | POA: Diagnosis not present

## 2023-06-04 DIAGNOSIS — N5201 Erectile dysfunction due to arterial insufficiency: Secondary | ICD-10-CM | POA: Diagnosis not present

## 2023-06-04 LAB — MICROSCOPIC EXAMINATION

## 2023-06-04 LAB — URINALYSIS, COMPLETE
Bilirubin, UA: NEGATIVE
Glucose, UA: NEGATIVE
Ketones, UA: NEGATIVE
Leukocytes,UA: NEGATIVE
Nitrite, UA: NEGATIVE
Specific Gravity, UA: >1.03 — ABNORMAL HIGH (ref 1.005–1.030)
Urobilinogen, Ur: 0.2 mg/dL (ref 0.2–1.0)
pH, UA: 5.5 (ref 5.0–7.5)

## 2023-06-04 LAB — BLADDER SCAN AMB NON-IMAGING: Scan Result: 2

## 2023-06-04 MED ORDER — TADALAFIL 10 MG PO TABS: ORAL_TABLET | ORAL | 3 refills | Status: DC

## 2023-06-04 NOTE — Patient Instructions (Signed)

## 2023-06-04 NOTE — Progress Notes (Signed)
I, Maysun Anabel Bene, acting as a scribe for Riki Altes, MD., have documented all relevant documentation on the behalf of Riki Altes, MD, as directed by Riki Altes, MD while in the presence of Riki Altes, MD.  06/04/2023 1:15 PM   Derrick Carroll March 14, 1951 [id number]  Referring provider: Jerl Mina, MD [address] [facility] [place],  [place] [address]  Chief Complaint  Patient presents with   Benign Prostatic Hypertrophy   Urologic history: 1.  BPH TURP Dr. [name] 2017 finasteride 5 mg daily   2.  Erectile dysfunction Generic sildenafil   3.  Renal cysts Bilateral simple renal cysts  HPI: Derrick Carroll is a 72 y.o. male presents for annual follow-up.   Doing well since last visit No bothersome LUTS; IPSS 4/35 Denies dysuria, gross hematuria Denies flank, abdominal or pelvic pain Remains on finasteride Remains on sildenafil but complaining of headache   PMH: Past Medical History:  Diagnosis Date   BPH (benign prostatic hyperplasia)    ED (erectile dysfunction)    Migraine headache    Renal cyst     Home Medications:  Allergies as of 06/04/2023       Reactions   Chlorpheniramine    Other reaction(s): Unknown        Medication List        Accurate as of June 04, 2023  1:15 PM. If you have any questions, ask your nurse or doctor.          STOP taking these medications    magnesium oxide 400 MG tablet Commonly known as: MAG-OX Stopped by: Riki Altes       TAKE these medications    Cholecalciferol 25 MCG (1000 UT) tablet Take by mouth.   finasteride 5 MG tablet Commonly known as: PROSCAR TAKE 1 TABLET (5 MG TOTAL) BY MOUTH DAILY.   meloxicam 15 MG tablet Commonly known as: MOBIC TAKE 1 TABLET (15 MG TOTAL) BY MOUTH DAILY.   Multi-Vitamins Tabs Take by mouth.   nortriptyline 10 MG capsule Commonly known as: PAMELOR Take by mouth.   sildenafil 20 MG tablet Commonly known as: REVATIO TAKE  3-5 TABLETS BY MOUTH EVERY DAY AS NEEDED   simvastatin 20 MG tablet Commonly known as: ZOCOR TAKE 1 TABLET BY MOUTH EVERY DAY AT NIGHT   simvastatin 20 MG tablet Commonly known as: ZOCOR Take 1 tablet by mouth at bedtime.   SUMAtriptan 100 MG tablet Commonly known as: IMITREX Take by mouth.   tadalafil 10 MG tablet Commonly known as: Cialis 1 tablet 1 hour prior to intercourse Started by: Riki Altes   Ubrelvy 100 MG Tabs Generic drug: Ubrogepant Take by mouth.        Allergies:  Allergies  Allergen Reactions   Chlorpheniramine     Other reaction(s): Unknown    Social History:  reports that he quit smoking about 49 years ago. His smoking use included cigarettes. He has never used smokeless tobacco. He reports that he does not currently use alcohol. He reports that he does not use drugs.   Physical Exam: BP (!) 141/96   Pulse (!) 55   Ht 5\' 10"  (1.778 m)   Wt 173 lb (78.5 kg)   BMI 24.82 kg/m   Constitutional:  Alert, No acute distress. HEENT: [place] AT Respiratory: Normal respiratory effort, no increased work of breathing. Psychiatric: Normal mood and affect.   Urinalysis Dipstick trace blood/1+ protein, microscopy 3-10 RBC  Assessment & Plan:    1. BPH with LUTS Stable PVR today 2 cc.  Continue finasteride.   2. Microhematuria New problem AUA hematuria risk stratification: high.  We discussed the recommended evaluation for high-risk hematuria, which includes CT urogram and cystoscopy. Both procedures were discussed in detail. I recommended further evaluation and he has elected to proceed. Cystoscopy scheduled and CT order.   I have reviewed the above documentation for accuracy and completeness, and I agree with the above.   Riki Altes, MD  [facility] [address] [place], [place] [address] [phone]

## 2023-06-18 ENCOUNTER — Ambulatory Visit
Admission: RE | Admit: 2023-06-18 | Discharge: 2023-06-18 | Disposition: A | Discharge status: Home / Self Care | Payer: Medicare PPO | Source: Ambulatory Visit | Attending: Urology | Admitting: Urology

## 2023-06-18 DIAGNOSIS — R3129 Other microscopic hematuria: Secondary | ICD-10-CM | POA: Diagnosis present

## 2023-06-18 MED ORDER — IOHEXOL 300 MG/ML  SOLN
100.0000 mL | Freq: Once | INTRAMUSCULAR | Status: AC | PRN
  Administered 2023-06-18: 100 mL via INTRAVENOUS

## 2023-07-03 ENCOUNTER — Ambulatory Visit: Payer: Medicare PPO

## 2023-07-03 DIAGNOSIS — K640 First degree hemorrhoids: Secondary | ICD-10-CM | POA: Diagnosis not present

## 2023-07-03 DIAGNOSIS — Z1211 Encounter for screening for malignant neoplasm of colon: Secondary | ICD-10-CM | POA: Diagnosis not present

## 2023-07-03 DIAGNOSIS — K5730 Diverticulosis of large intestine without perforation or abscess without bleeding: Secondary | ICD-10-CM | POA: Diagnosis not present

## 2023-07-09 ENCOUNTER — Encounter: Payer: Self-pay | Admitting: Urology

## 2023-07-09 ENCOUNTER — Ambulatory Visit: Payer: Medicare PPO | Admitting: Urology

## 2023-07-09 VITALS — BP 150/86 | HR 74 | Ht 70.0 in | Wt 172.0 lb

## 2023-07-09 DIAGNOSIS — R3129 Other microscopic hematuria: Secondary | ICD-10-CM | POA: Diagnosis not present

## 2023-07-09 DIAGNOSIS — N1339 Other hydronephrosis: Secondary | ICD-10-CM

## 2023-07-09 LAB — URINALYSIS, COMPLETE
Bilirubin, UA: NEGATIVE
Glucose, UA: NEGATIVE
Ketones, UA: NEGATIVE
Leukocytes,UA: NEGATIVE
Nitrite, UA: NEGATIVE
Protein,UA: NEGATIVE
RBC, UA: NEGATIVE
Specific Gravity, UA: >1.03 — ABNORMAL HIGH (ref 1.005–1.030)
Urobilinogen, Ur: 0.2 mg/dL (ref 0.2–1.0)
pH, UA: 5.5 (ref 5.0–7.5)

## 2023-07-09 LAB — MICROSCOPIC EXAMINATION: Bacteria, UA: NONE SEEN

## 2023-07-09 NOTE — Progress Notes (Signed)
   07/09/23  CC:  Chief Complaint  Patient presents with   Cysto    HPI: 72 y.o. male found to have microhematuria at annual office visit last month.  CTU performed 06/18/2023 showed small hepatic cysts and multiple bilateral renal sinus cyst L >R.  Left renal pelvis noted to be dilated with felt consistent with a UPJ obstruction  Blood pressure (!) 150/86, pulse 74, height 5\' 10"  (1.778 m), weight 172 lb (78 kg). NED. A&Ox3.   No respiratory distress   Abd soft, NT, ND Normal phallus with bilateral descended testicles  Cystoscopy Procedure Note  Patient identification was confirmed, informed consent was obtained, and patient was prepped using Betadine solution.  Lidocaine jelly was administered per urethral meatus.     Pre-Procedure: - Inspection reveals a normal caliber urethral meatus.  Procedure: The flexible cystoscope was introduced without difficulty - No urethral strictures/lesions are present. 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

## 2023-07-16 ENCOUNTER — Ambulatory Visit
Admission: RE | Admit: 2023-07-16 | Discharge: 2023-07-16 | Disposition: A | Discharge status: Home / Self Care | Payer: Medicare PPO | Source: Ambulatory Visit | Attending: Urology | Admitting: Urology

## 2023-07-16 DIAGNOSIS — N1339 Other hydronephrosis: Secondary | ICD-10-CM | POA: Diagnosis present

## 2023-07-16 MED ORDER — FUROSEMIDE 10 MG/ML IJ SOLN
39.0000 mg | Freq: Once | INTRAMUSCULAR | Status: DC
  Filled 2023-07-16: qty 3.9

## 2023-07-16 MED ORDER — TECHNETIUM TC 99M MERTIATIDE
5.3100 | Freq: Once | INTRAVENOUS | Status: AC | PRN
  Administered 2023-07-16: 5.31 via INTRAVENOUS

## 2023-08-04 ENCOUNTER — Other Ambulatory Visit: Payer: Self-pay | Admitting: *Deleted

## 2023-08-04 DIAGNOSIS — N1339 Other hydronephrosis: Secondary | ICD-10-CM

## 2023-08-04 DIAGNOSIS — R3129 Other microscopic hematuria: Secondary | ICD-10-CM

## 2023-08-11 ENCOUNTER — Other Ambulatory Visit: Payer: Self-pay | Admitting: Urology

## 2023-09-10 ENCOUNTER — Other Ambulatory Visit: Payer: Self-pay | Admitting: Urology

## 2023-10-28 ENCOUNTER — Other Ambulatory Visit: Payer: Self-pay | Admitting: Nurse Practitioner

## 2023-10-28 DIAGNOSIS — R42 Dizziness and giddiness: Secondary | ICD-10-CM

## 2023-10-28 DIAGNOSIS — I700 Atherosclerosis of aorta: Secondary | ICD-10-CM

## 2023-10-28 DIAGNOSIS — E782 Mixed hyperlipidemia: Secondary | ICD-10-CM

## 2023-11-04 ENCOUNTER — Ambulatory Visit
Admission: RE | Admit: 2023-11-04 | Discharge: 2023-11-04 | Disposition: A | Discharge status: Home / Self Care | Source: Ambulatory Visit | Attending: Nurse Practitioner | Admitting: Nurse Practitioner

## 2023-11-04 DIAGNOSIS — R42 Dizziness and giddiness: Secondary | ICD-10-CM | POA: Insufficient documentation

## 2023-11-04 DIAGNOSIS — I700 Atherosclerosis of aorta: Secondary | ICD-10-CM | POA: Diagnosis present

## 2023-11-04 DIAGNOSIS — E782 Mixed hyperlipidemia: Secondary | ICD-10-CM | POA: Diagnosis present

## 2023-11-10 ENCOUNTER — Emergency Department
Admission: EM | Admit: 2023-11-10 | Discharge: 2023-11-10 | Disposition: A | Discharge status: Home / Self Care | Attending: Emergency Medicine | Admitting: Emergency Medicine

## 2023-11-10 ENCOUNTER — Emergency Department

## 2023-11-10 ENCOUNTER — Other Ambulatory Visit: Payer: Self-pay

## 2023-11-10 DIAGNOSIS — R6884 Jaw pain: Secondary | ICD-10-CM | POA: Insufficient documentation

## 2023-11-10 DIAGNOSIS — R0789 Other chest pain: Secondary | ICD-10-CM | POA: Insufficient documentation

## 2023-11-10 DIAGNOSIS — I10 Essential (primary) hypertension: Secondary | ICD-10-CM | POA: Insufficient documentation

## 2023-11-10 DIAGNOSIS — R42 Dizziness and giddiness: Secondary | ICD-10-CM | POA: Insufficient documentation

## 2023-11-10 DIAGNOSIS — R0602 Shortness of breath: Secondary | ICD-10-CM | POA: Diagnosis not present

## 2023-11-10 LAB — BASIC METABOLIC PANEL WITH GFR
Anion gap: 7 (ref 5–15)
BUN: 19 mg/dL (ref 8–23)
CO2: 26 mmol/L (ref 22–32)
Calcium: 9.2 mg/dL (ref 8.9–10.3)
Chloride: 107 mmol/L (ref 98–111)
Creatinine, Ser: 1.05 mg/dL (ref 0.61–1.24)
GFR, Estimated: >60 mL/min (ref >60)
Glucose, Bld: 99 mg/dL (ref 70–99)
Potassium: 4.1 mmol/L (ref 3.5–5.1)
Sodium: 140 mmol/L (ref 135–145)

## 2023-11-10 LAB — TROPONIN I (HIGH SENSITIVITY)
Troponin I (High Sensitivity): 3 ng/L (ref <18)
Troponin I (High Sensitivity): <2 ng/L (ref <18)

## 2023-11-10 LAB — CBC
HCT: 49.1 % (ref 39.0–52.0)
Hemoglobin: 16.1 g/dL (ref 13.0–17.0)
MCH: 32.7 pg (ref 26.0–34.0)
MCHC: 32.8 g/dL (ref 30.0–36.0)
MCV: 99.6 fL (ref 80.0–100.0)
Platelets: 226 10*3/uL (ref 150–400)
RBC: 4.93 MIL/uL (ref 4.22–5.81)
RDW: 12 % (ref 11.5–15.5)
WBC: 6.4 10*3/uL (ref 4.0–10.5)
nRBC: 0 % (ref 0.0–0.2)

## 2023-11-10 NOTE — ED Provider Notes (Signed)
 [facility] Provider Note    Event Date/Time   First MD Initiated Contact with Patient 11/10/23 1153     (approximate)   History   Shortness of Breath and Jaw Pain   HPI  Derrick Carroll is a 73 y.o. male who presents to the ED for evaluation of Shortness of Breath and Jaw Pain   Reviewed PCP visit from 3/25.  History of HTN, HLD, RA.  Seen for positional dizziness. Also review of his recent outpatient carotid ultrasounds from 4/8, minor atherosclerosis, less than 50% bilateral narrowing.  Patient presents for evaluation of 1+ month of intermittent positional vertigo and intermittent chest discomfort.  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  Reports concern for his heart number to make sure that he is OK   Physical Exam   Triage Vital Signs: ED Triage Vitals  Encounter Vitals Group     BP 11/10/23 0907 (!) 166/93     Systolic BP Percentile --      Diastolic BP Percentile --      Pulse Rate 11/10/23 0907 (!) 52     Resp 11/10/23 0907 18     Temp 11/10/23 0907 97.7 F (36.5 C)     Temp Source 11/10/23 0907 Oral     SpO2 11/10/23 0907 95 %     Weight 11/10/23 0908 170 lb (77.1 kg)     Height 11/10/23 0908 5\' 10"  (1.778 m)     Head Circumference --      Peak Flow --      Pain Score 11/10/23 0907 4     Pain Loc --      Pain Education --      Exclude from Growth Chart --     Most recent vital signs: Vitals:   11/10/23 0907 11/10/23 1114  BP: (!) 166/93 (!) 154/96  Pulse: (!) 52 (!) 54  Resp: 18 18  Temp: 97.7 F (36.5 C) 97.8 F (36.6 C)  SpO2: 95% 98%    General: Awake, no distress.  CV:  Good peripheral perfusion.  Resp:  Normal effort.  Abd:  No distention.  MSK:  No deformity noted.  Neuro:  No focal deficits appreciated. Other:     ED Results / Procedures / Treatments    Labs (all labs ordered are listed, but only abnormal results are displayed) Labs Reviewed  BASIC METABOLIC PANEL WITH GFR  CBC  TROPONIN I (HIGH SENSITIVITY)  TROPONIN I (HIGH SENSITIVITY)    EKG Sinus rhythm with a rate of 61 bpm.  Normal axis and intervals.  No for signs of acute ischemia.  RADIOLOGY CXR interpreted by me without evidence of acute cardiopulmonary pathology.  Official radiology report(s): DG Chest 2 View Result Date: 11/10/2023 CLINICAL DATA:  Chest pain EXAM: CHEST - 2 VIEW COMPARISON:  08/15/2021 FINDINGS: The lungs are clear without focal pneumonia, edema, pneumothorax or pleural effusion. Nodular density/densities projecting over the lungs are compatible with pads for telemetry leads. The cardiopericardial silhouette is within normal limits for size. No acute bony abnormality. IMPRESSION: No active cardiopulmonary disease. Electronically Signed   By: Kennith Center M.D.   On: 11/10/2023 09:52    PROCEDURES and INTERVENTIONS:  .1-3 Lead EKG Interpretation  Performed by: Delton Prairie, MD Authorized by: Delton Prairie, MD     Interpretation: normal  ECG rate:  62   ECG rate assessment: normal     Rhythm: sinus rhythm     Ectopy: none     Conduction: normal     Medications - No data to display   IMPRESSION / MDM / ASSESSMENT AND PLAN / ED COURSE  I reviewed the triage vital signs and the nursing notes.  Differential diagnosis includes, but is not limited to, ACS, PTX, PNA, muscle strain/spasm, PE, dissection, anxiety, pleural effusion  {Patient presents with symptoms of an acute illness or injury that is potentially life-threatening.  Patient presents with atypical intermittent chest discomfort and positional vertigo that is likely peripheral in etiology, ultimately suitable for outpatient management.  Reassuring workup with nonischemic EKG, 2 negative troponins, normal CBC and BMP, clear CXR no persistent symptoms.  I considered admission of this  patient with believe he is suitable for outpatient management.      FINAL CLINICAL IMPRESSION(S) / ED DIAGNOSES   Final diagnoses:  Vertigo  Other chest pain     Rx / DC Orders   ED Discharge Orders     None        Note:  This document was prepared using Dragon voice recognition software and may include unintentional dictation errors.   Arline Bennett, MD 11/10/23 [phone]

## 2023-11-10 NOTE — ED Notes (Signed)
 See triage note  Presents with some jaw and SOB  States was [facility] in from [facility]  Afebrile on arrival

## 2023-11-10 NOTE — ED Triage Notes (Signed)
 Pt presents to ED multiple complaints, pt c/o of SOB, bilateral jaw pain. Pt was seen at [facility] for his dizziness that he is also complaining of now. Pt states he had a carotic US  last week, pt states no results from that. Pt did not take BP meds today, pt has HX of same.

## 2024-01-13 ENCOUNTER — Other Ambulatory Visit: Payer: Self-pay | Admitting: Urology

## 2024-02-13 ENCOUNTER — Other Ambulatory Visit: Payer: Self-pay | Admitting: Urology

## 2024-02-13 DIAGNOSIS — N5201 Erectile dysfunction due to arterial insufficiency: Secondary | ICD-10-CM

## 2024-05-03 ENCOUNTER — Ambulatory Visit
Admission: RE | Admit: 2024-05-03 | Discharge: 2024-05-03 | Disposition: A | Discharge status: Home / Self Care | Source: Ambulatory Visit | Attending: Urology | Admitting: Urology

## 2024-05-03 DIAGNOSIS — R3129 Other microscopic hematuria: Secondary | ICD-10-CM | POA: Diagnosis present

## 2024-05-03 DIAGNOSIS — N1339 Other hydronephrosis: Secondary | ICD-10-CM | POA: Insufficient documentation

## 2024-05-06 ENCOUNTER — Ambulatory Visit: Payer: Self-pay | Admitting: Urology

## 2024-07-21 ENCOUNTER — Other Ambulatory Visit: Payer: Self-pay | Admitting: Urology

## 2024-08-04 ENCOUNTER — Ambulatory Visit: Payer: Self-pay | Admitting: Urology

## 2024-08-04 ENCOUNTER — Encounter: Payer: Self-pay | Admitting: Urology

## 2024-08-04 VITALS — BP 147/90 | HR 80 | Ht 70.0 in | Wt 172.0 lb

## 2024-08-04 DIAGNOSIS — N5201 Erectile dysfunction due to arterial insufficiency: Secondary | ICD-10-CM | POA: Diagnosis not present

## 2024-08-04 DIAGNOSIS — R3129 Other microscopic hematuria: Secondary | ICD-10-CM

## 2024-08-04 DIAGNOSIS — N401 Enlarged prostate with lower urinary tract symptoms: Secondary | ICD-10-CM

## 2024-08-04 DIAGNOSIS — N1339 Other hydronephrosis: Secondary | ICD-10-CM

## 2024-08-04 LAB — URINALYSIS, COMPLETE
Bilirubin, UA: NEGATIVE
Glucose, UA: NEGATIVE
Ketones, UA: NEGATIVE
Leukocytes,UA: NEGATIVE
Nitrite, UA: NEGATIVE
Protein,UA: NEGATIVE
RBC, UA: NEGATIVE
Specific Gravity, UA: <1.005 — ABNORMAL LOW (ref 1.005–1.030)
Urobilinogen, Ur: 0.2 mg/dL (ref 0.2–1.0)
pH, UA: 6 (ref 5.0–7.5)

## 2024-08-04 LAB — MICROSCOPIC EXAMINATION
Bacteria, UA: NONE SEEN
RBC, Urine: NONE SEEN /HPF (ref 0–2)

## 2024-08-04 MED ORDER — TADALAFIL 10 MG PO TABS: ORAL_TABLET | ORAL | 0 refills | Status: AC

## 2024-08-04 NOTE — Progress Notes (Signed)
 "  08/04/2024 9:26 AM   Derrick Carroll 12-07-1950 [id number]  Referring provider: Valora Lynwood FALCON, MD [address] [place],  [place] [address]  Chief Complaint  Patient presents with   Benign Prostatic Hypertrophy   Urologic history: 1.  BPH TURP Dr. Ike 2017 finasteride  5 mg daily   2.  Erectile dysfunction Generic tadalafil    3.  Renal cysts Bilateral simple renal cysts  4.  Microhematuria CT urogram 06/18/2023 with bilateral parapelvic renal cyst.  Mild-moderate dilation left renal pelvis felt consistent with UPJ Lasix  renogram 07/16/2023 with normal bilateral renal function and no evidence of obstruction  5.Left hydronephrosis As above   HPI: Derrick Carroll is a 74 y.o. male presents for annual follow-up  No changes since last year's visit No bothersome LUTS; remains on finasteride  No flank, abdominal or pelvic pain No significant side effects with tadalafil  PSA 06/02/2024 stable 0.11   PMH: Past Medical History:  Diagnosis Date   BPH (benign prostatic hyperplasia)    ED (erectile dysfunction)    Migraine headache    Renal cyst     Surgical History: History reviewed. No pertinent surgical history.  Home Medications:  Allergies as of 08/04/2024       Reactions   Chlorpheniramine    Other reaction(s): Unknown        Medication List        Accurate as of August 04, 2024  9:26 AM. If you have any questions, ask your nurse or doctor.          Cholecalciferol 25 MCG (1000 UT) tablet Take by mouth.   finasteride  5 MG tablet Commonly known as: PROSCAR  TAKE 1 TABLET (5 MG TOTAL) BY MOUTH DAILY.   meloxicam  15 MG tablet Commonly known as: MOBIC  TAKE 1 TABLET (15 MG TOTAL) BY MOUTH DAILY.   Multi-Vitamins Tabs Take by mouth.   nortriptyline 10 MG capsule Commonly known as: PAMELOR Take by mouth.   simvastatin 20 MG tablet Commonly known as: ZOCOR Take 1 tablet by mouth at bedtime. What changed: Another  medication with the same name was removed. Continue taking this medication, and follow the directions you see here. Changed by: Glendia Barba, MD   SUMAtriptan 100 MG tablet Commonly known as: IMITREX Take by mouth.   tadalafil  10 MG tablet Commonly known as: CIALIS  1 TABLET 1 HOUR PRIOR TO INTERCOURSE   Ubrelvy 100 MG Tabs Generic drug: Ubrogepant Take by mouth.        Allergies: Allergies[1]  Family History: History reviewed. No pertinent family history.  Social History:  reports that he quit smoking about 51 years ago. His smoking use included cigarettes. He has never used smokeless tobacco. He reports that he does not currently use alcohol. He reports that he does not use drugs.   Physical Exam: BP (!) 147/90   Pulse 80   Ht 5' 10 (1.778 m)   Wt 172 lb (78 kg)   BMI 24.68 kg/m   Constitutional:  Alert, No acute distress. HEENT: [place] AT Respiratory: Normal respiratory effort, no increased work of breathing. Psychiatric: Normal mood and affect.    Pertinent Imaging: RUS images were personally reviewed and interpreted.  Stable mild left hydronephrosis  US  RENAL  Narrative CLINICAL DATA:  Microhematuria  EXAM: RENAL / URINARY TRACT ULTRASOUND COMPLETE  COMPARISON:  CT abdomen pelvis 06/18/2023  FINDINGS: Right Kidney:  Renal measurements: 12.5 x 4.6 x 5.0 cm = volume: 152 mL. Echogenicity within normal limits. No  mass or hydronephrosis visualized.  Left Kidney:  Renal measurements: 12.0 x 6.0 x 5.2 cm = volume: 193 mL. Echogenicity within normal limits. Multiple simple parapelvic cysts are again seen which do not require dedicated imaging surveillance. Mild hydronephrosis with dilated extrarenal pelvis is unchanged.  Bladder:  Appears normal for degree of bladder distention.  Other:  None.  IMPRESSION: 1. No significant sonographic abnormality of the RIGHT kidney. 2. Unchanged appearance of the LEFT kidney with multiple simple parapelvic  cysts, mild hydronephrosis, and dilated extrarenal pelvis.   Electronically Signed By: Aliene Lloyd M.D. On: 05/06/2024 07:51   Assessment & Plan:    1. Benign prostatic hyperplasia with LUTS Stable on finasteride   2.  Microhematuria UA today negative dipstick; no RBCs on microscopy  3.  Erectile dysfunction Stable, tadalafil  refilled  4.  Left hydronephrosis Nonobstructive left hydronephrosis on renal scan Stable mild hydronephrosis on recent RUS   1 year follow-up with PVR   Glendia JAYSON Barba, MD  [facility] [address] [place], [place] [address] [phone]    [1]  Allergies Allergen Reactions   Chlorpheniramine     Other reaction(s): Unknown   "

## 2025-08-05 ENCOUNTER — Ambulatory Visit: Admitting: Urology
